# Patient Record
Sex: Female | Born: 1935 | Hispanic: Refuse to answer | Marital: Married | Smoking: Never smoker
Health system: Southern US, Community
[De-identification: ages and names within clinical notes are randomized; demographics above are authoritative.]

## PROBLEM LIST (undated history)

## (undated) DIAGNOSIS — N189 Chronic kidney disease, unspecified: Secondary | ICD-10-CM

## (undated) DIAGNOSIS — F32A Depression, unspecified: Secondary | ICD-10-CM

## (undated) DIAGNOSIS — E569 Vitamin deficiency, unspecified: Secondary | ICD-10-CM

## (undated) DIAGNOSIS — E785 Hyperlipidemia, unspecified: Secondary | ICD-10-CM

## (undated) DIAGNOSIS — F329 Major depressive disorder, single episode, unspecified: Secondary | ICD-10-CM

## (undated) DIAGNOSIS — R413 Other amnesia: Secondary | ICD-10-CM

## (undated) DIAGNOSIS — I6529 Occlusion and stenosis of unspecified carotid artery: Secondary | ICD-10-CM

## (undated) DIAGNOSIS — I10 Essential (primary) hypertension: Secondary | ICD-10-CM

## (undated) HISTORY — DX: Hyperlipidemia, unspecified: E78.5

## (undated) HISTORY — DX: Other amnesia: R41.3

## (undated) HISTORY — DX: Occlusion and stenosis of unspecified carotid artery: I65.29

## (undated) HISTORY — DX: Vitamin deficiency, unspecified: E56.9

## (undated) HISTORY — DX: Chronic kidney disease, unspecified: N18.9

## (undated) HISTORY — DX: Essential (primary) hypertension: I10

## (undated) HISTORY — DX: Depression, unspecified: F32.A

## (undated) HISTORY — PX: ABDOMINAL HYSTERECTOMY: SHX81

## (undated) HISTORY — DX: Major depressive disorder, single episode, unspecified: F32.9

---

## 2010-07-17 ENCOUNTER — Telehealth (INDEPENDENT_AMBULATORY_CARE_PROVIDER_SITE_OTHER): Payer: Self-pay | Admitting: *Deleted

## 2010-07-24 NOTE — Progress Notes (Signed)
Summary: old cxr- new consult  Phone Note Call from Patient Call back at Home Phone    Caller: Patient Call For: clance Summary of Call: pt is a new consult scheduled for 07/31/10 w/ dr Levester Fresh- referring dr is dr. Charlesetta Shanks. pt called to say that she has tried to get records re: old cxr from [facility]. they state they are unable to locate- pt. isn't sure when she had this done. however, notes from refer dr. are being faxed. if anything further is needed- call pt at [phone] Initial call taken by: Tivis Ringer, CNA,  July 17, 2010 2:19 PM  Follow-up for Phone Call        noted. Arman Filter LPN  July 17, 2010 3:53 PM

## 2010-07-31 ENCOUNTER — Encounter: Payer: Self-pay | Admitting: Pulmonary Disease

## 2010-07-31 ENCOUNTER — Ambulatory Visit (INDEPENDENT_AMBULATORY_CARE_PROVIDER_SITE_OTHER): Payer: Medicare Other | Admitting: Pulmonary Disease

## 2010-07-31 ENCOUNTER — Ambulatory Visit (INDEPENDENT_AMBULATORY_CARE_PROVIDER_SITE_OTHER)
Admission: RE | Admit: 2010-07-31 | Discharge: 2010-07-31 | Disposition: A | Discharge status: Home / Self Care | Payer: Medicare Other | Source: Ambulatory Visit | Attending: Pulmonary Disease | Admitting: Pulmonary Disease

## 2010-07-31 VITALS — BP 142/74 | HR 76 | Temp 97.9°F | Ht 64.0 in | Wt 188.2 lb

## 2010-07-31 DIAGNOSIS — R053 Chronic cough: Secondary | ICD-10-CM | POA: Insufficient documentation

## 2010-07-31 DIAGNOSIS — R05 Cough: Secondary | ICD-10-CM

## 2010-07-31 DIAGNOSIS — R059 Cough, unspecified: Secondary | ICD-10-CM

## 2010-07-31 MED ORDER — OMEPRAZOLE 40 MG PO CPDR: 40.0000 mg | DELAYED_RELEASE_CAPSULE | Freq: Two times a day (BID) | ORAL | Status: DC

## 2010-07-31 MED ORDER — BENZONATATE 100 MG PO CAPS: 200.0000 mg | ORAL_CAPSULE | Freq: Four times a day (QID) | ORAL | Status: AC | PRN

## 2010-07-31 NOTE — Assessment & Plan Note (Signed)
The pt has a chronic cough that is most likely upper rather than lower airway in origin.  She had significant improvement with starting PPI, but then returned.  Most likely etiologies for this include PND, LPR, and cyclical cough.  Will check a cxr for completeness, but will intensify treatment for reflux and work on behavioral therapies that will help a cyclical cough.  Will also treat for postnasal drip emperically.

## 2010-07-31 NOTE — Progress Notes (Signed)
  Subjective:    Patient ID: Amanda Owen, female    DOB: 1922-06-04, 77 y.o.   MRN: [id number]  HPI The pt is an 77y/o female who I have been asked to see for chronic cough.  Started about 2 mos ago after a URI, and has continued.  It improved very little with d/c ace, but did greatly improve after starting once a day PPI.  Is dry in nature, and associated with a tickle in throat.  Denies globus sensation, but does have throat clearing.  She denies any chest congestion or worsening sob.  She denies PND, but has dysphonia at times during our visit which clears with throat clearing.  She has never smoked.  Her last cxr was about a year ago, and was unremarkable.     Review of Systems  Constitutional: Negative for fever, appetite change and unexpected weight change.  HENT: Positive for rhinorrhea and sneezing. Negative for ear pain, congestion and postnasal drip.   Eyes: Negative for redness.  Respiratory: Positive for cough and shortness of breath. Negative for chest tightness and wheezing.   Cardiovascular: Negative for chest pain, palpitations and leg swelling.  Gastrointestinal: Negative for nausea, abdominal pain and diarrhea.  Genitourinary: Negative for dysuria.  Musculoskeletal: Negative for joint swelling.  Skin: Negative for rash.  Neurological: Negative for syncope and headaches.  Hematological: Does not bruise/bleed easily.  Psychiatric/Behavioral: Positive for dysphoric mood. The patient is not nervous/anxious.        Objective:   Physical Exam Constitutional:  Well developed, no acute distress  HENT:  Nares patent without discharge  Oropharynx without exudate, palate and uvula are normal  There does appear to be PND present  Eyes:  Perrla, eomi, no scleral icterus  Neck:  No JVD, no TMG  Cardiovascular:  Normal rate, regular rhythm, no rubs or gallops.  No murmurs        Intact distal pulses  Pulmonary :  Normal breath sounds, no stridor or respiratory distress   No  rales, rhonchi, or wheezing  Abdominal:  Soft, nondistended, bowel sounds present.  No tenderness noted.   Musculoskeletal:  +1lower extremity edema noted.  Lymph Nodes:  No cervical lymphadenopathy noted  Skin:  No cyanosis noted  Neurologic:  Alert, appropriate, moves all 4 extremities without obvious deficit.         Assessment & Plan:

## 2010-07-31 NOTE — Patient Instructions (Signed)
Increase omeprazole for reflux to one in am AND pm until next visit Chlorpheniramine over the counter 8mg  at bedtime for postnasal drip No yelling, singing, or overusing your voice No throat clearing.  Just swallow or drink something. Can take tessalon pearls for cough as directed. Hard candy (no peppermint or menthol) all during the day to bathe the back of your throat and prevent tickle followup with me in 3 weeks.

## 2010-08-01 ENCOUNTER — Telehealth: Payer: Self-pay | Admitting: *Deleted

## 2010-08-01 ENCOUNTER — Encounter: Payer: Self-pay | Admitting: Pulmonary Disease

## 2010-08-01 NOTE — Telephone Encounter (Signed)
-----   Message from Barbaraann Share, MD sent at 08/01/2010 1:53 PM -----  Please let pt know that her cxr shows possible pna, but it may just be scarring Call in levquin 750mg  one each day for 5 days, but have her continue with the other things we discussed.     [id number]

## 2010-08-01 NOTE — Progress Notes (Signed)
  Subjective:    Patient ID: Amanda Owen, female    DOB: [birth year], 77 y.o.   MRN: [id number]  HPI    Review of Systems     Objective:   Physical Exam        Assessment & Plan:

## 2010-08-01 NOTE — Progress Notes (Signed)
  Subjective:    Patient ID: Amanda Owen, female    DOB: 07/03/1922, 77 y.o.   MRN: [id number]  HPI    Review of Systems     Objective:   Physical Exam        Assessment & Plan:   

## 2010-08-01 NOTE — Progress Notes (Signed)
The pt's cxr may indicate right lung pna.  Will treat with abx to see if cough improves.

## 2010-08-02 NOTE — Telephone Encounter (Signed)
[id number] 

## 2010-08-02 NOTE — Telephone Encounter (Signed)
LMOM at home # and cell #.

## 2010-08-06 MED ORDER — LEVOFLOXACIN 750 MG PO TABS: ORAL_TABLET | ORAL | Status: DC

## 2010-08-06 NOTE — Telephone Encounter (Signed)
Pt's daughter, Ardeth Sportsman, returned call to [place].  She was informed of pt's CXR results and recs per [facility].  She verbalized understanding of this and aware abx sent to [facility] in [place].

## 2010-08-27 ENCOUNTER — Ambulatory Visit: Payer: Medicare Other | Admitting: Pulmonary Disease

## 2010-08-29 ENCOUNTER — Ambulatory Visit (INDEPENDENT_AMBULATORY_CARE_PROVIDER_SITE_OTHER): Payer: Medicare Other | Admitting: Pulmonary Disease

## 2010-08-29 ENCOUNTER — Ambulatory Visit (INDEPENDENT_AMBULATORY_CARE_PROVIDER_SITE_OTHER)
Admission: RE | Admit: 2010-08-29 | Discharge: 2010-08-29 | Disposition: A | Discharge status: Home / Self Care | Payer: Medicare Other | Source: Ambulatory Visit | Attending: Pulmonary Disease | Admitting: Pulmonary Disease

## 2010-08-29 ENCOUNTER — Encounter: Payer: Self-pay | Admitting: Pulmonary Disease

## 2010-08-29 DIAGNOSIS — J189 Pneumonia, unspecified organism: Secondary | ICD-10-CM

## 2010-08-29 DIAGNOSIS — R05 Cough: Secondary | ICD-10-CM

## 2010-08-29 MED ORDER — PREDNISONE 10 MG PO TABS: ORAL_TABLET | ORAL | Status: DC

## 2010-08-29 MED ORDER — BENZONATATE 100 MG PO CAPS: 200.0000 mg | ORAL_CAPSULE | Freq: Four times a day (QID) | ORAL | Status: AC | PRN

## 2010-08-29 NOTE — Progress Notes (Signed)
  Subjective:    Patient ID: Amanda Owen, female    DOB: 1922-10-01, 77 y.o.   MRN: [id number]  HPI The pt comes in today for f/u of her chronic cough.  She was felt to have an upper airway issue related to postnasal drip, cyclical cough mechanism, and possible reflux.  She was treated for common upper airway causes of chronic cough, but cxr that day with early infiltrate? Vs scar or atx?  She was treated with a course of abx in addition to the above measures.  She comes in today where she feels her cough is 50% better at least.  Unfortunately, she quit the behavioral therapies we discussed last visit, and now is having gagging/constant throat clearing during our visit.  The daughter is concerned because she is still coughing up small quantity of discolored mucus at times.  The pt denies sob or chest congestion.  She is having postnasal drip   Review of Systems  Constitutional: Negative for fever and unexpected weight change.  HENT: Positive for rhinorrhea and postnasal drip. Negative for ear pain, nosebleeds, congestion, sore throat, sneezing, trouble swallowing, dental problem and sinus pressure.   Eyes: Negative for redness and itching.  Respiratory: Positive for cough. Negative for chest tightness, shortness of breath and wheezing.   Cardiovascular: Negative for palpitations and leg swelling.  Gastrointestinal: Negative for nausea and vomiting.  Genitourinary: Negative for dysuria.  Musculoskeletal: Negative for joint swelling.  Skin: Negative for rash.  Neurological: Negative for headaches.  Hematological: Does not bruise/bleed easily.  Psychiatric/Behavioral: Negative for dysphoric mood. The patient is not nervous/anxious.        Objective:   Physical Exam Obese female in nad Nares with inflammatory changes, mild purulence seen in right nare OP clear, no exudates Chest totally clear to auscultation.  No wheezing or rhonchi Cor with rrr LE with 1+ edema, no cyanosis Alert and  oriented, moves all 4        Assessment & Plan:

## 2010-08-29 NOTE — Assessment & Plan Note (Signed)
The pt's states her cough is 50% better with treatment for upper airway causes of cough.  However, she quit doing the various behavioral therapies we discussed, and today in the office she is doing a lot of gagging and throat clearing.  I have told her the cough will never resolve until this is "cooled off".  She needs to get back on her treatments from last visit.  Will also treat her with a course of prednisone to help with upper airway inflammation.  Her cxr showed ?pna, and she was treated with a course of abx.  I suspect she did not have pna, but will check f/u cxr for completeness.  I am concerned about the possibility of sinusitis, with her continuing to have postnasal drip and having some purulent mucus with cough.  Will check a ct sinuses to evaluate for this.

## 2010-08-29 NOTE — Patient Instructions (Addendum)
You need to continue with the behavioral therapies we discussed:  No throat clearing          Hard candy in mouth all day to avoid cough and throat clearing          Limit voice use as much as possible     Will check followup cxr today to see if abnormality still present Will check xray of sinuses, and call you with results Can use tessalon pearls 100mg  2 every 6hrs if needed for cough Stay on medication for acid reflux at twice a day Will put on short course of prednisone to help with upper airway inflammation as well.

## 2010-09-03 ENCOUNTER — Telehealth: Payer: Self-pay | Admitting: Pulmonary Disease

## 2010-09-03 ENCOUNTER — Ambulatory Visit (INDEPENDENT_AMBULATORY_CARE_PROVIDER_SITE_OTHER)
Admission: RE | Admit: 2010-09-03 | Discharge: 2010-09-03 | Disposition: A | Discharge status: Home / Self Care | Payer: Medicare Other | Source: Ambulatory Visit | Attending: Pulmonary Disease | Admitting: Pulmonary Disease

## 2010-09-03 DIAGNOSIS — J189 Pneumonia, unspecified organism: Secondary | ICD-10-CM

## 2010-09-03 NOTE — Telephone Encounter (Signed)
Called and spoke with pt's daughter.  Daughter aware of pt's cxr results and stated she will relay message to pt.

## 2010-09-19 ENCOUNTER — Emergency Department (HOSPITAL_COMMUNITY)
Admission: EM | Admit: 2010-09-19 | Discharge: 2010-09-19 | Disposition: A | Discharge status: Home / Self Care | Payer: Medicare Other | Attending: Emergency Medicine | Admitting: Emergency Medicine

## 2010-09-19 DIAGNOSIS — F068 Other specified mental disorders due to known physiological condition: Secondary | ICD-10-CM | POA: Insufficient documentation

## 2010-09-19 DIAGNOSIS — G309 Alzheimer's disease, unspecified: Secondary | ICD-10-CM | POA: Insufficient documentation

## 2010-09-19 DIAGNOSIS — I10 Essential (primary) hypertension: Secondary | ICD-10-CM | POA: Insufficient documentation

## 2010-09-19 DIAGNOSIS — Z79899 Other long term (current) drug therapy: Secondary | ICD-10-CM | POA: Insufficient documentation

## 2010-09-19 DIAGNOSIS — F0280 Dementia in other diseases classified elsewhere without behavioral disturbance: Secondary | ICD-10-CM | POA: Insufficient documentation

## 2010-09-19 DIAGNOSIS — R55 Syncope and collapse: Secondary | ICD-10-CM | POA: Insufficient documentation

## 2010-09-19 DIAGNOSIS — N289 Disorder of kidney and ureter, unspecified: Secondary | ICD-10-CM | POA: Insufficient documentation

## 2010-09-19 LAB — URINALYSIS, ROUTINE W REFLEX MICROSCOPIC
Bilirubin Urine: NEGATIVE
Glucose, UA: NEGATIVE mg/dL
Hgb urine dipstick: NEGATIVE
Ketones, ur: 15 mg/dL — AB
Protein, ur: NEGATIVE mg/dL
pH: 5 (ref 5.0–8.0)

## 2010-09-19 LAB — BASIC METABOLIC PANEL
BUN: 18 mg/dL (ref 6–23)
CO2: 28 mEq/L (ref 19–32)
Calcium: 10.1 mg/dL (ref 8.4–10.5)
Chloride: 104 mEq/L (ref 96–112)
Creatinine, Ser: 1.65 mg/dL — ABNORMAL HIGH (ref 0.4–1.2)
GFR calc Af Amer: 36 mL/min — ABNORMAL LOW (ref >60)
Glucose, Bld: 93 mg/dL (ref 70–99)

## 2010-09-19 LAB — DIFFERENTIAL
Basophils Relative: 0 % (ref 0–1)
Lymphs Abs: 1.6 10*3/uL (ref 0.7–4.0)
Monocytes Absolute: 1 10*3/uL (ref 0.1–1.0)
Monocytes Relative: 11 % (ref 3–12)
Neutro Abs: 6.3 10*3/uL (ref 1.7–7.7)
Neutrophils Relative %: 69 % (ref 43–77)

## 2010-09-19 LAB — CBC
Hemoglobin: 12.9 g/dL (ref 12.0–15.0)
MCH: 29.8 pg (ref 26.0–34.0)
MCHC: 32.8 g/dL (ref 30.0–36.0)
MCV: 90.8 fL (ref 78.0–100.0)
RBC: 4.33 MIL/uL (ref 3.87–5.11)

## 2013-03-04 ENCOUNTER — Ambulatory Visit: Payer: Self-pay | Admitting: Internal Medicine

## 2014-02-26 ENCOUNTER — Emergency Department (HOSPITAL_BASED_OUTPATIENT_CLINIC_OR_DEPARTMENT_OTHER)
Admission: EM | Admit: 2014-02-26 | Discharge: 2014-02-26 | Disposition: A | Discharge status: Home / Self Care | Payer: Medicare Other | Attending: Emergency Medicine | Admitting: Emergency Medicine

## 2014-02-26 ENCOUNTER — Emergency Department (HOSPITAL_BASED_OUTPATIENT_CLINIC_OR_DEPARTMENT_OTHER): Payer: Medicare Other

## 2014-02-26 ENCOUNTER — Encounter (HOSPITAL_BASED_OUTPATIENT_CLINIC_OR_DEPARTMENT_OTHER): Payer: Self-pay | Admitting: Emergency Medicine

## 2014-02-26 DIAGNOSIS — I129 Hypertensive chronic kidney disease with stage 1 through stage 4 chronic kidney disease, or unspecified chronic kidney disease: Secondary | ICD-10-CM | POA: Insufficient documentation

## 2014-02-26 DIAGNOSIS — N189 Chronic kidney disease, unspecified: Secondary | ICD-10-CM | POA: Diagnosis not present

## 2014-02-26 DIAGNOSIS — Z7902 Long term (current) use of antithrombotics/antiplatelets: Secondary | ICD-10-CM | POA: Insufficient documentation

## 2014-02-26 DIAGNOSIS — H269 Unspecified cataract: Secondary | ICD-10-CM | POA: Insufficient documentation

## 2014-02-26 DIAGNOSIS — F329 Major depressive disorder, single episode, unspecified: Secondary | ICD-10-CM | POA: Insufficient documentation

## 2014-02-26 DIAGNOSIS — Z7982 Long term (current) use of aspirin: Secondary | ICD-10-CM | POA: Diagnosis not present

## 2014-02-26 DIAGNOSIS — Z23 Encounter for immunization: Secondary | ICD-10-CM | POA: Diagnosis not present

## 2014-02-26 DIAGNOSIS — S6992XA Unspecified injury of left wrist, hand and finger(s), initial encounter: Secondary | ICD-10-CM | POA: Diagnosis present

## 2014-02-26 DIAGNOSIS — E785 Hyperlipidemia, unspecified: Secondary | ICD-10-CM | POA: Diagnosis not present

## 2014-02-26 DIAGNOSIS — Z79899 Other long term (current) drug therapy: Secondary | ICD-10-CM | POA: Diagnosis not present

## 2014-02-26 DIAGNOSIS — R609 Edema, unspecified: Secondary | ICD-10-CM

## 2014-02-26 DIAGNOSIS — S62325A Displaced fracture of shaft of fourth metacarpal bone, left hand, initial encounter for closed fracture: Secondary | ICD-10-CM | POA: Insufficient documentation

## 2014-02-26 DIAGNOSIS — S0093XA Contusion of unspecified part of head, initial encounter: Secondary | ICD-10-CM | POA: Diagnosis not present

## 2014-02-26 DIAGNOSIS — F0390 Unspecified dementia without behavioral disturbance: Secondary | ICD-10-CM | POA: Diagnosis not present

## 2014-02-26 DIAGNOSIS — Y92008 Other place in unspecified non-institutional (private) residence as the place of occurrence of the external cause: Secondary | ICD-10-CM | POA: Insufficient documentation

## 2014-02-26 DIAGNOSIS — W19XXXA Unspecified fall, initial encounter: Secondary | ICD-10-CM

## 2014-02-26 DIAGNOSIS — W1789XA Other fall from one level to another, initial encounter: Secondary | ICD-10-CM | POA: Diagnosis not present

## 2014-02-26 DIAGNOSIS — S62309A Unspecified fracture of unspecified metacarpal bone, initial encounter for closed fracture: Secondary | ICD-10-CM

## 2014-02-26 DIAGNOSIS — Z9229 Personal history of other drug therapy: Secondary | ICD-10-CM

## 2014-02-26 DIAGNOSIS — S0081XA Abrasion of other part of head, initial encounter: Secondary | ICD-10-CM

## 2014-02-26 MED ORDER — HYDROCODONE-ACETAMINOPHEN 5-325 MG PO TABS: 1.0000 | ORAL_TABLET | Freq: Four times a day (QID) | ORAL | Status: DC | PRN

## 2014-02-26 MED ORDER — TETANUS-DIPHTH-ACELL PERTUSSIS 5-2.5-18.5 LF-MCG/0.5 IM SUSP
0.5000 mL | Freq: Once | INTRAMUSCULAR | Status: AC
  Administered 2014-02-26: 0.5 mL via INTRAMUSCULAR
  Filled 2014-02-26: qty 0.5

## 2014-02-26 NOTE — ED Notes (Signed)
EMT Tresa EndoKelly at bedside to spilt pt's hand

## 2014-02-26 NOTE — ED Notes (Signed)
D/c home with family- rx x 1 for hydrocodone given- tetanus info sheet given- follow up care discussed with pt and family

## 2014-02-26 NOTE — Discharge Instructions (Signed)
Call Dr. Mina MarbleWeingold, or another orthopedic specialist for further evaluation of your fracture. Call for a follow up appointment with a Family or Primary Care Provider.  Return if Symptoms worsen.   Take medication as prescribed.  Keep splint on at all times.  Ice your head and hand 3-4 times a day.  Elevate your hand above your heart to reduce swelling and discomfort.

## 2014-02-26 NOTE — ED Provider Notes (Signed)
Medical screening examination/treatment/procedure(s) were conducted as a shared visit with non-physician practitioner(s) and myself.  I personally evaluated the patient during the encounter.patient is an 80 year old female with history of dementia on Aggrenox. She presents after a fall. She apparently tripped over the rug and struck her forehead on the floor. She denies loss of consciousness but does have significant swelling to her forehead and abrasions to the bridge of her nose. She denies neck pain and also complains of pain in her left hand.  On exam, vitals are stable and the patient is afebrile. Exam of the head reveals a large contusion to the for head and abrasions to the bridge of the nose with no significant deformity. Heart is regular rate and rhythm. Lungs are clear. Neurologically cranial nerves II through XII are grossly intact without focal deficits. She moves all extremities. There is no cervical spine tenderness and no step-off.  Workup reveals negative CT of the head and no evidence for maxillofacial fractures. Hand x-ray does reveal a fourth metacarpal fracture will be treated with an ulnar gutter splint and follow-up with orthopedics.     Geoffery Lyonsouglas Rethel Sebek, MD 02/26/14 2308

## 2014-02-26 NOTE — ED Provider Notes (Signed)
CSN: [id number]     Arrival date & time 02/26/14  1610 History   First MD Initiated Contact with Patient 02/26/14 1647     Chief Complaint  Patient presents with  . Facial Injury     (Consider location/radiation/quality/duration/timing/severity/associated sxs/prior Treatment) HPI Comments: Patient is a 80 year old female with past mental history of dementia, on Aggrenox, presenting to the emergency Department chief complaint of head injury. Patient reports chemical fall, witnessed patient's daughter confirms mechanical fall. Denies LOC, change in vision, numbness tingling to upper and lower extremities. Patient at baseline mental status per daughter. Patient also complains of left hand swelling and pain. Pt is unable to see out of Right eye due to cataract. Unknown last Td. Level V caveat due to history of dementia.  Patient is a 80 y.o. female presenting with facial injury. The history is provided by the patient and a relative. No language interpreter was used.  Facial Injury   Past Medical History  Diagnosis Date  . Hypertension   . Hyperlipidemia   . Memory deficit   . Occlusion of carotid artery   . Vitamin deficiency   . Chronic kidney failure   . Depression    Past Surgical History  Procedure Laterality Date  . Abdominal hysterectomy     Family History  Problem Relation Age of Onset  . Heart disease Mother   . Heart disease Sister   . Heart disease Brother    History  Substance Use Topics  . Smoking status: Never Smoker   . Smokeless tobacco: Not on file  . Alcohol Use: No   OB History   Grav Para Term Preterm Abortions TAB SAB Ect Mult Living                 Review of Systems  Unable to perform ROS: Dementia      Allergies  Review of patient's allergies indicates no known allergies.  Home Medications   Prior to Admission medications   Medication Sig Start Date End Date Taking? Authorizing Provider  dipyridamole-aspirin (AGGRENOX) 200-25 MG per 12  hr capsule Take 1 capsule by mouth 2 (two) times daily.   Yes Historical Provider, MD  amLODipine (NORVASC) 5 MG tablet Take 5 mg by mouth daily.      Historical Provider, MD  aspirin 81 MG tablet Take 81 mg by mouth daily.      Historical Provider, MD  citalopram (CELEXA) 20 MG tablet Take 20 mg by mouth daily.      Historical Provider, MD  clopidogrel (PLAVIX) 75 MG tablet Take 75 mg by mouth daily.      Historical Provider, MD  donepezil (ARICEPT) 10 MG tablet Take 10 mg by mouth at bedtime as needed.      Historical Provider, MD  memantine (NAMENDA) 10 MG tablet Take 10 mg by mouth 2 (two) times daily.      Historical Provider, MD  omeprazole (PRILOSEC) 40 MG capsule Take 1 capsule (40 mg total) by mouth 2 (two) times daily. 07/31/10   Barbaraann ShareKeith M Clance, MD  predniSONE (DELTASONE) 10 MG tablet Take 4 tabs by mouth x 2 days, 2 tabs by mouth for 2 days, 1 tab by mouth x 2 days then stop 08/29/10   Barbaraann ShareKeith M Clance, MD  simvastatin (ZOCOR) 80 MG tablet Take 80 mg by mouth at bedtime.      Historical Provider, MD   BP 149/69  Pulse 80  Temp(Src) 97.8 F (36.6 C) (Oral)  Resp 80  Ht 5\' 4"  (1.626 m)  Wt 196 lb (88.905 kg)  BMI 33.63 kg/m2  SpO2 96% Physical Exam  Nursing note and vitals reviewed. Constitutional: She appears well-developed and well-nourished. No distress.  HENT:  Head: Normocephalic. Head is with abrasion and with contusion. Head is without raccoon's eyes and without Battle's sign.    Right Ear: No hemotympanum.  Left Ear: No hemotympanum.  Nose: No nasal deformity or septal deviation. No epistaxis.  Large hematoma to left-sided forehead. Abrasion to nasal bridge and right sided upper lip with minimal oozing blood. Upper lip abrasion, upper and lower dentures in place, no fracture. No malocclusion.  Neck: Neck supple.  Cardiovascular: Normal rate and regular rhythm.   Pulmonary/Chest: Effort normal. No respiratory distress. She has no wheezes. She has no rales.  Abdominal:  Soft. There is no tenderness. There is no rebound.  Musculoskeletal:       Left hand: She exhibits decreased range of motion, tenderness, bony tenderness and swelling.       Hands: Left upper extremity: Decrease 4th knuckle contour.  Moderate swelling to dorsum of hand.  Neurological: She is alert. She is disoriented. No cranial nerve deficit or sensory deficit. She exhibits normal muscle tone. Coordination normal. GCS eye subscore is 4. GCS verbal subscore is 5. GCS motor subscore is 6.  Oriented to person and place, daughter, disoriented to time. Speech is clear and goal oriented, follows commands II-Visual fields were normal.   III/IV/VI-Pupils were equal and reacted. Extraocular movements were full and conjugate.  V/VII-no facial droop.   VIII-normal.   Motor: Strength 5/5 to upper and lower extremities bilaterally. Moves all 4 extremities equally. Sensory: normal sensation to upper and lower extremities.  Cerebellar: Normal finger to nose bilaterally  Skin: Skin is warm and dry. She is not diaphoretic.  Psychiatric: Her behavior is normal.    ED Course  Procedures (including critical care time) Labs Review Labs Reviewed - No data to display  Imaging Review Dg Wrist Complete Left  02/26/2014   CLINICAL DATA:  Fall, left hand swelling. Left hand and wrist pain. Initial encounter.  EXAM: LEFT WRIST - COMPLETE 3+ VIEW  COMPARISON:  None.  FINDINGS: There is an oblique fracture through the left fourth metacarpal. Mild displacement. No additional acute bony abnormality.  IMPRESSION: Oblique displaced fracture through the left fourth metacarpal.   Electronically Signed   By: Charlett NoseKevin  Dover M.D.   On: 02/26/2014 18:20   Ct Head Wo Contrast  02/26/2014   CLINICAL DATA:  80 year old female with history of trauma after falling face forward out of a truck on to the ground. Hematoma on the forehead and laceration across the bridge of the nose, with abrasion on the cheeks.  EXAM: CT HEAD WITHOUT  CONTRAST  CT MAXILLOFACIAL WITHOUT CONTRAST  TECHNIQUE: Multidetector CT imaging of the head and maxillofacial structures were performed using the standard protocol without intravenous contrast. Multiplanar CT image reconstructions of the maxillofacial structures were also generated.  COMPARISON:  Head CT 06/16/2012.  FINDINGS: CT HEAD FINDINGS  Large amount of high attenuation soft tissue swelling in the frontal regions (right greater than left) extending into the region of the bridge of the nose, compatible with the reported hematoma/laceration. No acute displaced skull fractures are identified. Physiologic calcifications are noted in the basal ganglia bilaterally. Mild cerebral atrophy. Patchy and confluent areas of decreased attenuation are noted throughout the deep and periventricular white matter of the cerebral hemispheres bilaterally, compatible with chronic microvascular ischemic disease. No  acute intracranial abnormality. Specifically, no evidence of acute post-traumatic intracranial hemorrhage, no definite regions of acute/subacute cerebral ischemia, no focal mass, mass effect, hydrocephalus or abnormal intra or extra-axial fluid collections. The visualized paranasal sinuses and mastoids are well pneumatized.  CT MAXILLOFACIAL FINDINGS  As mentioned above, there is a large amount of high attenuation soft tissue swelling in the frontal scalp bilaterally (right greater than left) extending to the bridge of the nose, compatible with the reported hematoma/laceration. No underlying displaced facial bone fractures are noted. Pterygoid plates are intact. Mandibular condyles are located bilaterally, and the mandibles intact. Paranasal sinuses and mastoids are well pneumatized.  IMPRESSION: 1. Large frontal scalp hematoma/laceration with soft tissue swelling extending down to the bridge of the nose. No underlying displaced skull fracture or facial bone fractures. 2. No signs of significant acute traumatic injury  to the brain. 3. Mild cerebral atrophy with chronic microvascular ischemic changes in the cerebral white matter, similar to the prior study.   Electronically Signed   By: Trudie Reed M.D.   On: 02/26/2014 18:18   Dg Hand Complete Left  02/26/2014   CLINICAL DATA:  LEFT hand and wrist pain post fall, LEFT hand swelling  EXAM: LEFT HAND - COMPLETE 3+ VIEW  COMPARISON:  None  FINDINGS: Diffuse osseous demineralization.  Mild scattered narrowing of interphalangeal joints.  Displaced oblique fracture at diaphysis of fourth metacarpal.  No articular extension.  No additional fracture, dislocation, or bone destruction.  IMPRESSION: Displaced oblique fourth metacarpal diaphyseal fracture.  Osseous demineralization.   Electronically Signed   By: Ulyses Southward M.D.   On: 02/26/2014 18:21   Ct Maxillofacial Wo Cm  02/26/2014   CLINICAL DATA:  80 year old female with history of trauma after falling face forward out of a truck on to the ground. Hematoma on the forehead and laceration across the bridge of the nose, with abrasion on the cheeks.  EXAM: CT HEAD WITHOUT CONTRAST  CT MAXILLOFACIAL WITHOUT CONTRAST  TECHNIQUE: Multidetector CT imaging of the head and maxillofacial structures were performed using the standard protocol without intravenous contrast. Multiplanar CT image reconstructions of the maxillofacial structures were also generated.  COMPARISON:  Head CT 06/16/2012.  FINDINGS: CT HEAD FINDINGS  Large amount of high attenuation soft tissue swelling in the frontal regions (right greater than left) extending into the region of the bridge of the nose, compatible with the reported hematoma/laceration. No acute displaced skull fractures are identified. Physiologic calcifications are noted in the basal ganglia bilaterally. Mild cerebral atrophy. Patchy and confluent areas of decreased attenuation are noted throughout the deep and periventricular white matter of the cerebral hemispheres bilaterally, compatible  with chronic microvascular ischemic disease. No acute intracranial abnormality. Specifically, no evidence of acute post-traumatic intracranial hemorrhage, no definite regions of acute/subacute cerebral ischemia, no focal mass, mass effect, hydrocephalus or abnormal intra or extra-axial fluid collections. The visualized paranasal sinuses and mastoids are well pneumatized.  CT MAXILLOFACIAL FINDINGS  As mentioned above, there is a large amount of high attenuation soft tissue swelling in the frontal scalp bilaterally (right greater than left) extending to the bridge of the nose, compatible with the reported hematoma/laceration. No underlying displaced facial bone fractures are noted. Pterygoid plates are intact. Mandibular condyles are located bilaterally, and the mandibles intact. Paranasal sinuses and mastoids are well pneumatized.  IMPRESSION: 1. Large frontal scalp hematoma/laceration with soft tissue swelling extending down to the bridge of the nose. No underlying displaced skull fracture or facial bone fractures. 2. No signs of significant  acute traumatic injury to the brain. 3. Mild cerebral atrophy with chronic microvascular ischemic changes in the cerebral white matter, similar to the prior study.   Electronically Signed   By: Trudie Reed M.D.   On: 02/26/2014 18:18     EKG Interpretation None      MDM   Final diagnoses:  Fall  Swelling  Head contusion, initial encounter  Metacarpal bone fracture, closed, initial encounter  Facial abrasion, initial encounter   Patient presents after witnessed mechanical fall, no loss consciousness returned to baseline. Patient has large hematoma to forehead and history of aggrenox. No neurologic deficits on exam. Left dorsum of hand concerning for metacarpal fracture.  CT head and face without intercranial process, shows large hematoma and no facial fractures.  X-ray shows fourth metacarpal fracture, plan to treat with splint follow-up with a hand  specialist. Plan to update tetanus, wound care.  Discussed imaging results, and treatment plan with the patient and patient's family. Return precautions given. Reports understanding and no other concerns at this time.  Patient is stable for discharge at this time. Dr. Berniece Andreas also evaluated the patient during this encounter.  Meds given in ED:  Medications  Tdap (BOOSTRIX) injection 0.5 mL (0.5 mLs Intramuscular Given 02/26/14 1904)    New Prescriptions   HYDROCODONE-ACETAMINOPHEN (NORCO/VICODIN) 5-325 MG PER TABLET    Take 1 tablet by mouth every 6 (six) hours as needed for moderate pain or severe pain.        Mellody Drown, PA-C 02/26/14 2130

## 2014-02-26 NOTE — ED Notes (Addendum)
Patient fell at home getting out of a truck and injured her face. Abrasion to her nose, large hematoma to her forehead, and her left hand is swelling. Mild confusion is patients baseline.

## 2019-10-08 ENCOUNTER — Inpatient Hospital Stay (HOSPITAL_COMMUNITY)
Admission: EM | Admit: 2019-10-08 | Discharge: 2019-10-11 | DRG: 683 | Disposition: A | Discharge status: Skilled Nursing Facility | Payer: Medicare Other | Source: Skilled Nursing Facility | Attending: Internal Medicine | Admitting: Internal Medicine

## 2019-10-08 DIAGNOSIS — F419 Anxiety disorder, unspecified: Secondary | ICD-10-CM | POA: Diagnosis present

## 2019-10-08 DIAGNOSIS — Z66 Do not resuscitate: Secondary | ICD-10-CM | POA: Diagnosis present

## 2019-10-08 DIAGNOSIS — Z7982 Long term (current) use of aspirin: Secondary | ICD-10-CM

## 2019-10-08 DIAGNOSIS — Z8249 Family history of ischemic heart disease and other diseases of the circulatory system: Secondary | ICD-10-CM

## 2019-10-08 DIAGNOSIS — N179 Acute kidney failure, unspecified: Secondary | ICD-10-CM | POA: Diagnosis not present

## 2019-10-08 DIAGNOSIS — N3000 Acute cystitis without hematuria: Secondary | ICD-10-CM

## 2019-10-08 DIAGNOSIS — R8271 Bacteriuria: Secondary | ICD-10-CM | POA: Diagnosis present

## 2019-10-08 DIAGNOSIS — F0390 Unspecified dementia without behavioral disturbance: Secondary | ICD-10-CM | POA: Diagnosis present

## 2019-10-08 DIAGNOSIS — N189 Chronic kidney disease, unspecified: Secondary | ICD-10-CM | POA: Diagnosis present

## 2019-10-08 DIAGNOSIS — I129 Hypertensive chronic kidney disease with stage 1 through stage 4 chronic kidney disease, or unspecified chronic kidney disease: Secondary | ICD-10-CM | POA: Diagnosis present

## 2019-10-08 DIAGNOSIS — I951 Orthostatic hypotension: Secondary | ICD-10-CM | POA: Diagnosis present

## 2019-10-08 DIAGNOSIS — I10 Essential (primary) hypertension: Secondary | ICD-10-CM | POA: Diagnosis present

## 2019-10-08 DIAGNOSIS — R778 Other specified abnormalities of plasma proteins: Secondary | ICD-10-CM | POA: Diagnosis present

## 2019-10-08 DIAGNOSIS — Z8673 Personal history of transient ischemic attack (TIA), and cerebral infarction without residual deficits: Secondary | ICD-10-CM

## 2019-10-08 DIAGNOSIS — I4581 Long QT syndrome: Secondary | ICD-10-CM | POA: Diagnosis present

## 2019-10-08 DIAGNOSIS — Z79899 Other long term (current) drug therapy: Secondary | ICD-10-CM

## 2019-10-08 DIAGNOSIS — F0391 Unspecified dementia with behavioral disturbance: Secondary | ICD-10-CM | POA: Diagnosis present

## 2019-10-08 DIAGNOSIS — E876 Hypokalemia: Secondary | ICD-10-CM | POA: Diagnosis present

## 2019-10-08 DIAGNOSIS — F329 Major depressive disorder, single episode, unspecified: Secondary | ICD-10-CM | POA: Diagnosis present

## 2019-10-08 DIAGNOSIS — R7989 Other specified abnormal findings of blood chemistry: Secondary | ICD-10-CM

## 2019-10-08 DIAGNOSIS — R4182 Altered mental status, unspecified: Secondary | ICD-10-CM

## 2019-10-08 DIAGNOSIS — Z7902 Long term (current) use of antithrombotics/antiplatelets: Secondary | ICD-10-CM

## 2019-10-08 DIAGNOSIS — Z90710 Acquired absence of both cervix and uterus: Secondary | ICD-10-CM

## 2019-10-08 DIAGNOSIS — R55 Syncope and collapse: Secondary | ICD-10-CM

## 2019-10-08 DIAGNOSIS — E785 Hyperlipidemia, unspecified: Secondary | ICD-10-CM | POA: Diagnosis present

## 2019-10-08 DIAGNOSIS — E861 Hypovolemia: Secondary | ICD-10-CM | POA: Diagnosis present

## 2019-10-08 DIAGNOSIS — Z20822 Contact with and (suspected) exposure to covid-19: Secondary | ICD-10-CM | POA: Diagnosis present

## 2019-10-08 NOTE — ED Triage Notes (Signed)
Pt presents to ED from [facility]. Per facility faculty wheeling pt back to room in wheelchair. Pt had "syncopal episode" and was assisted to floor from chair. Pt GCS: 9. EMS unaware if this is baseline. EMS VS-  131/50 HR - 51

## 2019-10-09 ENCOUNTER — Emergency Department (HOSPITAL_COMMUNITY): Payer: Medicare Other

## 2019-10-09 ENCOUNTER — Observation Stay (HOSPITAL_COMMUNITY): Payer: Medicare Other

## 2019-10-09 ENCOUNTER — Encounter (HOSPITAL_COMMUNITY): Payer: Self-pay | Admitting: Family Medicine

## 2019-10-09 ENCOUNTER — Other Ambulatory Visit: Payer: Self-pay

## 2019-10-09 DIAGNOSIS — R55 Syncope and collapse: Secondary | ICD-10-CM | POA: Diagnosis present

## 2019-10-09 DIAGNOSIS — G9341 Metabolic encephalopathy: Secondary | ICD-10-CM

## 2019-10-09 DIAGNOSIS — R8271 Bacteriuria: Secondary | ICD-10-CM | POA: Diagnosis present

## 2019-10-09 DIAGNOSIS — N3000 Acute cystitis without hematuria: Secondary | ICD-10-CM | POA: Insufficient documentation

## 2019-10-09 DIAGNOSIS — F0390 Unspecified dementia without behavioral disturbance: Secondary | ICD-10-CM | POA: Diagnosis present

## 2019-10-09 DIAGNOSIS — N179 Acute kidney failure, unspecified: Secondary | ICD-10-CM | POA: Diagnosis not present

## 2019-10-09 DIAGNOSIS — F0391 Unspecified dementia with behavioral disturbance: Secondary | ICD-10-CM

## 2019-10-09 DIAGNOSIS — E876 Hypokalemia: Secondary | ICD-10-CM | POA: Diagnosis present

## 2019-10-09 DIAGNOSIS — I10 Essential (primary) hypertension: Secondary | ICD-10-CM

## 2019-10-09 DIAGNOSIS — R778 Other specified abnormalities of plasma proteins: Secondary | ICD-10-CM

## 2019-10-09 DIAGNOSIS — Z8673 Personal history of transient ischemic attack (TIA), and cerebral infarction without residual deficits: Secondary | ICD-10-CM

## 2019-10-09 LAB — GLUCOSE, CAPILLARY
Glucose-Capillary: 69 mg/dL — ABNORMAL LOW (ref 70–99)
Glucose-Capillary: 117 mg/dL — ABNORMAL HIGH (ref 70–99)
Glucose-Capillary: 101 mg/dL — ABNORMAL HIGH (ref 70–99)
Glucose-Capillary: 79 mg/dL (ref 70–99)

## 2019-10-09 LAB — CBC WITH DIFFERENTIAL/PLATELET
Abs Immature Granulocytes: 0.05 10*3/uL (ref 0.00–0.07)
Basophils Absolute: 0 10*3/uL (ref 0.0–0.1)
Basophils Relative: 0 %
Eosinophils Absolute: 0 10*3/uL (ref 0.0–0.5)
Eosinophils Relative: 1 %
HCT: 42.9 % (ref 36.0–46.0)
Hemoglobin: 13.3 g/dL (ref 12.0–15.0)
Immature Granulocytes: 1 %
Lymphocytes Relative: 19 %
Lymphs Abs: 1.6 10*3/uL (ref 0.7–4.0)
MCH: 29.8 pg (ref 26.0–34.0)
MCHC: 31 g/dL (ref 30.0–36.0)
MCV: 96 fL (ref 80.0–100.0)
Monocytes Absolute: 0.6 10*3/uL (ref 0.1–1.0)
Monocytes Relative: 8 %
Neutro Abs: 5.9 10*3/uL (ref 1.7–7.7)
Neutrophils Relative %: 71 %
Platelets: 244 10*3/uL (ref 150–400)
RBC: 4.47 MIL/uL (ref 3.87–5.11)
RDW: 16.2 % — ABNORMAL HIGH (ref 11.5–15.5)
WBC: 8.3 10*3/uL (ref 4.0–10.5)
nRBC: 0 % (ref 0.0–0.2)

## 2019-10-09 LAB — TROPONIN I (HIGH SENSITIVITY)
Troponin I (High Sensitivity): 24 ng/L — ABNORMAL HIGH (ref <18)
Troponin I (High Sensitivity): 24 ng/L — ABNORMAL HIGH

## 2019-10-09 LAB — URINALYSIS, ROUTINE W REFLEX MICROSCOPIC
Bilirubin Urine: NEGATIVE
Glucose, UA: NEGATIVE mg/dL
Ketones, ur: 5 mg/dL — AB
Nitrite: NEGATIVE
Protein, ur: 100 mg/dL — AB
Specific Gravity, Urine: 1.019 (ref 1.005–1.030)
pH: 5 (ref 5.0–8.0)

## 2019-10-09 LAB — COMPREHENSIVE METABOLIC PANEL
ALT: 16 U/L (ref 0–44)
AST: 23 U/L (ref 15–41)
Albumin: 2.9 g/dL — ABNORMAL LOW (ref 3.5–5.0)
Alkaline Phosphatase: 81 U/L (ref 38–126)
Anion gap: 11 (ref 5–15)
BUN: 26 mg/dL — ABNORMAL HIGH (ref 8–23)
CO2: 23 mmol/L (ref 22–32)
Calcium: 9 mg/dL (ref 8.9–10.3)
Chloride: 104 mmol/L (ref 98–111)
Creatinine, Ser: 1.41 mg/dL — ABNORMAL HIGH (ref 0.44–1.00)
GFR calc Af Amer: 37 mL/min — ABNORMAL LOW (ref >60)
GFR calc non Af Amer: 32 mL/min — ABNORMAL LOW (ref >60)
Glucose, Bld: 98 mg/dL (ref 70–99)
Potassium: 3.4 mmol/L — ABNORMAL LOW (ref 3.5–5.1)
Sodium: 138 mmol/L (ref 135–145)
Total Bilirubin: 0.6 mg/dL (ref 0.3–1.2)
Total Protein: 6.2 g/dL — ABNORMAL LOW (ref 6.5–8.1)

## 2019-10-09 LAB — TSH: TSH: 1.758 u[IU]/mL (ref 0.350–4.500)

## 2019-10-09 LAB — URINALYSIS, MICROSCOPIC (REFLEX): WBC, UA: >50 WBC/hpf (ref 0–5)

## 2019-10-09 LAB — SARS CORONAVIRUS 2 BY RT PCR (HOSPITAL ORDER, PERFORMED IN ~~LOC~~ HOSPITAL LAB): SARS Coronavirus 2: NEGATIVE

## 2019-10-09 LAB — VITAMIN B12: Vitamin B-12: 426 pg/mL (ref 180–914)

## 2019-10-09 LAB — SODIUM, URINE, RANDOM: Sodium, Ur: 61 mmol/L

## 2019-10-09 LAB — CREATININE, URINE, RANDOM: Creatinine, Urine: 244.89 mg/dL

## 2019-10-09 MED ORDER — TRAZODONE HCL 50 MG PO TABS
50.0000 mg | ORAL_TABLET | Freq: Every day | ORAL | Status: DC
  Administered 2019-10-09 – 2019-10-10 (×2): 50 mg via ORAL
  Filled 2019-10-09 (×2): qty 1

## 2019-10-09 MED ORDER — HEPARIN SODIUM (PORCINE) 5000 UNIT/ML IJ SOLN
5000.0000 [IU] | Freq: Three times a day (TID) | INTRAMUSCULAR | Status: DC
  Administered 2019-10-09 – 2019-10-11 (×6): 5000 [IU] via SUBCUTANEOUS
  Filled 2019-10-09 (×6): qty 1

## 2019-10-09 MED ORDER — MAGNESIUM SULFATE IN D5W 1-5 GM/100ML-% IV SOLN
1.0000 g | Freq: Once | INTRAVENOUS | Status: AC
  Administered 2019-10-09: 1 g via INTRAVENOUS
  Filled 2019-10-09: qty 100

## 2019-10-09 MED ORDER — SODIUM CHLORIDE 0.9 % IV SOLN
1.0000 g | Freq: Once | INTRAVENOUS | Status: AC
  Administered 2019-10-09: 1 g via INTRAVENOUS
  Filled 2019-10-09: qty 10

## 2019-10-09 MED ORDER — SENNOSIDES-DOCUSATE SODIUM 8.6-50 MG PO TABS: 1.0000 | ORAL_TABLET | Freq: Every evening | ORAL | Status: DC | PRN

## 2019-10-09 MED ORDER — SODIUM CHLORIDE 0.9% FLUSH
3.0000 mL | Freq: Two times a day (BID) | INTRAVENOUS | Status: DC
  Administered 2019-10-09 – 2019-10-10 (×4): 3 mL via INTRAVENOUS

## 2019-10-09 MED ORDER — MEMANTINE HCL ER 28 MG PO CP24
28.0000 mg | ORAL_CAPSULE | Freq: Every day | ORAL | Status: DC
  Administered 2019-10-09 – 2019-10-10 (×2): 28 mg via ORAL
  Filled 2019-10-09 (×3): qty 1

## 2019-10-09 MED ORDER — ACETAMINOPHEN 650 MG RE SUPP: 650.0000 mg | Freq: Four times a day (QID) | RECTAL | Status: DC | PRN

## 2019-10-09 MED ORDER — ASPIRIN-DIPYRIDAMOLE ER 25-200 MG PO CP12
1.0000 | ORAL_CAPSULE | Freq: Two times a day (BID) | ORAL | Status: DC
  Administered 2019-10-09 – 2019-10-10 (×4): 1 via ORAL
  Filled 2019-10-09 (×8): qty 1

## 2019-10-09 MED ORDER — ACETAMINOPHEN 325 MG PO TABS
650.0000 mg | ORAL_TABLET | Freq: Four times a day (QID) | ORAL | Status: DC | PRN
  Filled 2019-10-09: qty 2

## 2019-10-09 MED ORDER — ALPRAZOLAM 0.25 MG PO TABS: 0.2500 mg | ORAL_TABLET | Freq: Three times a day (TID) | ORAL | Status: DC | PRN

## 2019-10-09 MED ORDER — CITALOPRAM HYDROBROMIDE 20 MG PO TABS
20.0000 mg | ORAL_TABLET | Freq: Every day | ORAL | Status: DC
  Administered 2019-10-09 – 2019-10-10 (×2): 20 mg via ORAL
  Filled 2019-10-09 (×3): qty 1

## 2019-10-09 MED ORDER — POTASSIUM CHLORIDE IN NACL 20-0.9 MEQ/L-% IV SOLN
INTRAVENOUS | Status: AC
  Filled 2019-10-09: qty 1000

## 2019-10-09 MED ORDER — LORAZEPAM 2 MG/ML IJ SOLN: 0.5000 mg | Freq: Once | INTRAMUSCULAR | Status: DC

## 2019-10-09 MED ORDER — METOPROLOL TARTRATE 25 MG PO TABS
25.0000 mg | ORAL_TABLET | Freq: Two times a day (BID) | ORAL | Status: DC
  Administered 2019-10-09 – 2019-10-10 (×4): 25 mg via ORAL
  Filled 2019-10-09 (×5): qty 1

## 2019-10-09 MED ORDER — TRAZODONE HCL 50 MG PO TABS
25.0000 mg | ORAL_TABLET | Freq: Two times a day (BID) | ORAL | Status: DC | PRN
  Administered 2019-10-09: 25 mg via ORAL
  Filled 2019-10-09: qty 1

## 2019-10-09 MED ORDER — SODIUM CHLORIDE 0.9 % IV SOLN
1.0000 g | INTRAVENOUS | Status: DC
  Administered 2019-10-10: 1 g via INTRAVENOUS
  Filled 2019-10-09: qty 10

## 2019-10-09 MED ORDER — PANTOPRAZOLE SODIUM 40 MG PO TBEC
40.0000 mg | DELAYED_RELEASE_TABLET | Freq: Every day | ORAL | Status: DC
  Administered 2019-10-09 – 2019-10-10 (×2): 40 mg via ORAL
  Filled 2019-10-09 (×3): qty 1

## 2019-10-09 MED ORDER — ATORVASTATIN CALCIUM 40 MG PO TABS
40.0000 mg | ORAL_TABLET | Freq: Every day | ORAL | Status: DC
  Administered 2019-10-09 – 2019-10-10 (×2): 40 mg via ORAL
  Filled 2019-10-09 (×3): qty 1

## 2019-10-09 NOTE — ED Provider Notes (Signed)
Patient seen/examined in the Emergency Department in conjunction with Advanced Practice Provider Sanders Patient had syncopal episode at nursing home Exam : awake/alert, appears confused but no acute distress Plan: imaging/labs pending at this time    Zadie Rhine, MD 10/09/19 [phone]

## 2019-10-09 NOTE — Progress Notes (Addendum)
[facility] PROGRESS NOTE    Progress Note  Amanda Owen  DTO:[id number] DOB: [birth year] DOA: 10/08/2019 PCP: Amanda Shanks, MD     Brief Narrative:   Amanda Owen is an 86 y.o. female past medical history significant for dementia, essential hypertension current admission [facility] for syncope no presents to the ED for evaluation for similar the ED vitals were stable, potassium 3.4 with a creatinine of 1.4 (baseline around 0.8 just a month ago) troponins unremarkable CBC unremarkable.  Assessment/Plan:   Recurrent syncope: Awaiting clear etiology there was loss of consciousness while setting, EKG shows line are 2-hour intervals are irregular we will repeat twelve-lead EKG to 500. Orthostatics pending Physical therapy pending Check  EEG, MRI brain, B12, TSH, RBC folate. Talking to the daughter she has advanced dementia, she has had episodes of recurrent syncope for the last 10 years.  Acute kidney injury: Unclear of her baseline creatinine.  Will try to obtain records from her PCP and skilled nursing facility.  Bacteriuria: There is no leukocytosis afebrile, she was a dose of Rocephin in the ED cultures are pending.  Prolonged QTC: Try to keep potassium greater than 4 magnesium greater than 2.  History of CVA: Continue Aggrenox and Lipitor.  Dementia with behavioral disturbances: Continue Namenda Celexa, Xanax and trazodone.  Essential hypertension: Continue metoprolol, continue to hold all other antihypertensive medication.  Elevated troponins: Troponins flat unlikely ACS.    DVT prophylaxis: lovenox Family [phone] daughter  Status is: Observation  The patient remains OBS appropriate and will d/c before 2 midnights.  Dispo: The patient is from: ALF              Anticipated d/c is to: SNF              Anticipated d/c date is: 1 day              Patient currently is not medically stable to d/c.  Code Status:     IV Access:     Peripheral IV   Procedures and diagnostic studies:   DG Chest 1 View  Result Date: 10/09/2019 CLINICAL DATA:  Syncope EXAM: CHEST  1 VIEW COMPARISON:  08/29/2019 FINDINGS: Lungs are clear.  No pleural effusion or pneumothorax. The heart is normal in size. IMPRESSION: No evidence of acute cardiopulmonary disease. Electronically Signed   By: Charline Bills M.D.   On: 10/09/2019 02:15   CT Head Wo Contrast  Result Date: 10/09/2019 CLINICAL DATA:  Encephalopathy. EXAM: CT HEAD WITHOUT CONTRAST TECHNIQUE: Contiguous axial images were obtained from the base of the skull through the vertex without intravenous contrast. COMPARISON:  Head CT 08/29/2019 at [facility] FINDINGS: Brain: No intracranial hemorrhage, mass effect, or midline shift. Stable degree of atrophy and chronic small vessel ischemia. No hydrocephalus. The basilar cisterns are patent. Bilateral basal gangliar mineralization. No evidence of territorial infarct or acute ischemia. Chronic linear calcification in the right frontal lobe. No extra-axial or intracranial fluid collection. Vascular: Atherosclerosis of skullbase vasculature without hyperdense vessel or abnormal calcification. Skull: No fracture or focal lesion. Sinuses/Orbits: Dysconjugate gaze, typically incidental. Left cataract resection. Paranasal sinuses and mastoid air cells are clear. The visualized orbits are unremarkable. Other: None. IMPRESSION: 1. No acute intracranial abnormality. 2. Stable atrophy and chronic small vessel ischemia. Electronically Signed   By: Narda Rutherford M.D.   On: 10/09/2019 01:55     Medical Consultants:    None.  Anti-Infectives:   None  Subjective:    Amanda Owen no  complaints, she is pleasantly confused.  Objective:    Vitals:   10/09/19 0100 10/09/19 0153 10/09/19 0800 10/09/19 0815  BP: (!) 143/44  (!) 168/56 (!) 140/47  Pulse: (!) 56  62 (!) 57  Resp: (!) 24  16 13   Temp:  (!) 96.4 F (35.8 C)    TempSrc:  Rectal      SpO2: 100%  100% 100%   SpO2: 100 % O2 Flow Rate (L/min): 10 L/min  No intake or output data in the 24 hours ending 10/09/19 0846 There were no vitals filed for this visit.  Exam: General exam: In no acute distress. Respiratory system: Good air movement and clear to auscultation. Cardiovascular system: S1 & S2 heard, RRR. No JVD. Gastrointestinal system: Abdomen is nondistended, soft and nontender.  Extremities: No pedal edema. Skin: No rashes, lesions or ulcers  Data Reviewed:    Labs: Basic Metabolic Panel: Recent Labs  Lab 10/09/19 0446  NA 138  K 3.4*  CL 104  CO2 23  GLUCOSE 98  BUN 26*  CREATININE 1.41*  CALCIUM 9.0   GFR CrCl cannot be calculated (Unknown ideal weight.). Liver Function Tests: Recent Labs  Lab 10/09/19 0446  AST 23  ALT 16  ALKPHOS 81  BILITOT 0.6  PROT 6.2*  ALBUMIN 2.9*   No results for input(s): LIPASE, AMYLASE in the last 168 hours. No results for input(s): AMMONIA in the last 168 hours. Coagulation profile No results for input(s): INR, PROTIME in the last 168 hours. COVID-19 Labs  No results for input(s): DDIMER, FERRITIN, LDH, CRP in the last 72 hours.  No results found for: SARSCOV2NAA  CBC: Recent Labs  Lab 10/09/19 0434  WBC 8.3  NEUTROABS 5.9  HGB 13.3  HCT 42.9  MCV 96.0  PLT 244   Cardiac Enzymes: No results for input(s): CKTOTAL, CKMB, CKMBINDEX, TROPONINI in the last 168 hours. BNP (last 3 results) No results for input(s): PROBNP in the last 8760 hours. CBG: No results for input(s): GLUCAP in the last 168 hours. D-Dimer: No results for input(s): DDIMER in the last 72 hours. Hgb A1c: No results for input(s): HGBA1C in the last 72 hours. Lipid Profile: No results for input(s): CHOL, HDL, LDLCALC, TRIG, CHOLHDL, LDLDIRECT in the last 72 hours. Thyroid function studies: No results for input(s): TSH, T4TOTAL, T3FREE, THYROIDAB in the last 72 hours.  Invalid input(s): FREET3 Anemia work up: No results  for input(s): VITAMINB12, FOLATE, FERRITIN, TIBC, IRON, RETICCTPCT in the last 72 hours. Sepsis Labs: Recent Labs  Lab 10/09/19 0434  WBC 8.3   Microbiology No results found for this or any previous visit (from the past 240 hour(s)).   Medications:   . metoprolol tartrate  25 mg Oral BID   Continuous Infusions: . 0.9 % NaCl with KCl 20 mEq / L    . magnesium sulfate bolus IVPB        LOS: 0 days   Charlynne Cousins  [facility]  10/09/2019, 8:46 AM

## 2019-10-09 NOTE — ED Notes (Signed)
Daughter Verdia Bolt [phone] updated on plan of care.  Requested admitting MD call her.  Daughter's contact information sent to Dr. Robb Matar.

## 2019-10-09 NOTE — Procedures (Signed)
Patient Name: Amanda Owen  MRN: [id number]  Epilepsy Attending: Charlsie Quest  Referring Physician/Provider: Dr Marinda Elk Date: 10/09/2019 Duration: 26.06 mins  Patient history: 86yo F with recurrent syncope. EEG to evaluate for seizure.   Level of alertness: Awake  AEDs during EEG study: None  Technical aspects: This EEG study was done with scalp electrodes positioned according to the 10-20 International system of electrode placement. Electrical activity was acquired at a sampling rate of 500Hz  and reviewed with a high frequency filter of 70Hz  and a low frequency filter of 1Hz . EEG data were recorded continuously and digitally stored.   Description: The posterior dominant rhythm consists of 9 Hz activity of moderate voltage (25-35 uV) seen predominantly in posterior head regions, symmetric and reactive to eye opening and eye closing. Hyperventilation and photic stimulation were not performed.     IMPRESSION: This study is within normal limits. No seizures or epileptiform discharges were seen throughout the recording.  Amanda Owen 

## 2019-10-09 NOTE — ED Notes (Signed)
Multiple attempts for IV unsuccessful.  IV team consult.

## 2019-10-09 NOTE — Progress Notes (Signed)
EEG complete - results pending 

## 2019-10-09 NOTE — ED Provider Notes (Signed)
[facility] EMERGENCY DEPARTMENT Provider Note   CSN: [id number] Arrival date & time: 10/08/19  2355     History Chief Complaint  Patient presents with  . ALOC    Amanda Owen is a 86 y.o. female.  The history is provided by the patient and medical records.   86 y.o. F with hx of CKD, depression, HLP, HTN, dementia, presenting to the ED for AMS.  Limited history from EMS.  Patient lives at [facility].  Reportedly she was in a chair today being wheeled down the hall, went unresponsive and was lowered to the floor.  She never lost pulses.  No trauma to the head/neck.Marland Kitchen  EMS was called and reported AMS but unknown baseline.  Patient denies pain.  She is pleasantly confused on my exam.    Past Medical History:  Diagnosis Date  . Chronic kidney failure   . Depression   . Hyperlipidemia   . Hypertension   . Memory deficit   . Occlusion of carotid artery   . Vitamin deficiency     Patient Active Problem List   Diagnosis Date Noted  . Chronic cough 07/31/2010    Past Surgical History:  Procedure Laterality Date  . ABDOMINAL HYSTERECTOMY       OB History   No obstetric history on file.     Family History  Problem Relation Age of Onset  . Heart disease Mother   . Heart disease Sister   . Heart disease Brother     Social History   Tobacco Use  . Smoking status: Never Smoker  Substance Use Topics  . Alcohol use: No  . Drug use: Not on file    Home Medications Prior to Admission medications   Medication Sig Start Date End Date Taking? Authorizing Provider  amLODipine (NORVASC) 5 MG tablet Take 5 mg by mouth daily.      [provider]  aspirin 81 MG tablet Take 81 mg by mouth daily.      [provider]  citalopram (CELEXA) 20 MG tablet Take 20 mg by mouth daily.      [provider]  clopidogrel (PLAVIX) 75 MG tablet Take 75 mg by mouth daily.      [provider]  dipyridamole-aspirin (AGGRENOX) 200-25 MG  per 12 hr capsule Take 1 capsule by mouth 2 (two) times daily.    [provider]  donepezil (ARICEPT) 10 MG tablet Take 10 mg by mouth at bedtime as needed.      [provider]  HYDROcodone-acetaminophen (NORCO/VICODIN) 5-325 MG per tablet Take 1 tablet by mouth every 6 (six) hours as needed for moderate pain or severe pain. 02/26/14   Mellody Drown, PA-C  memantine (NAMENDA) 10 MG tablet Take 10 mg by mouth 2 (two) times daily.      [provider]  omeprazole (PRILOSEC) 40 MG capsule Take 1 capsule (40 mg total) by mouth 2 (two) times daily. 07/31/10   Clance, Maree Krabbe, MD  predniSONE (DELTASONE) 10 MG tablet Take 4 tabs by mouth x 2 days, 2 tabs by mouth for 2 days, 1 tab by mouth x 2 days then stop 08/29/10   Barbaraann Share, MD  simvastatin (ZOCOR) 80 MG tablet Take 80 mg by mouth at bedtime.      [provider]    Allergies    Patient has no known allergies.  Review of Systems   Review of Systems  Unable to perform ROS: Dementia  Physical Exam Updated Vital Signs BP (!) 164/80 (BP Location: Right Arm)   Pulse (!) 59   Resp 16   Physical Exam Vitals and nursing note reviewed.  Constitutional:      Appearance: She is well-developed.     Comments: Awake, fidgeting  HENT:     Head: Normocephalic and atraumatic.  Eyes:     Conjunctiva/sclera: Conjunctivae normal.     Pupils: Pupils are equal, round, and reactive to light.  Cardiovascular:     Rate and Rhythm: Normal rate and regular rhythm.     Heart sounds: Normal heart sounds.  Pulmonary:     Effort: Pulmonary effort is normal.     Breath sounds: Normal breath sounds.  Abdominal:     General: Bowel sounds are normal.     Palpations: Abdomen is soft.  Musculoskeletal:        General: Normal range of motion.     Cervical back: Normal range of motion.  Skin:    General: Skin is warm and dry.  Neurological:     Mental Status: She is alert.     Comments: Awake, alert, oriented to  self, follows limited commands and answers limited questions, seems confused (unsure if worse than baseline)     ED Results / Procedures / Treatments   Labs (all labs ordered are listed, but only abnormal results are displayed) Labs Reviewed  URINALYSIS, ROUTINE W REFLEX MICROSCOPIC - Abnormal; Notable for the following components:      Result Value   APPearance TURBID (*)    Hgb urine dipstick MODERATE (*)    Ketones, ur 5 (*)    Protein, ur 100 (*)    Leukocytes,Ua SMALL (*)    All other components within normal limits  COMPREHENSIVE METABOLIC PANEL - Abnormal; Notable for the following components:   Potassium 3.4 (*)    BUN 26 (*)    Creatinine, Ser 1.41 (*)    Total Protein 6.2 (*)    Albumin 2.9 (*)    GFR calc non Af Amer 32 (*)    GFR calc Af Amer 37 (*)    All other components within normal limits  URINALYSIS, MICROSCOPIC (REFLEX) - Abnormal; Notable for the following components:   Bacteria, UA MANY (*)    All other components within normal limits  CBC WITH DIFFERENTIAL/PLATELET - Abnormal; Notable for the following components:   RDW 16.2 (*)    All other components within normal limits  TROPONIN I (HIGH SENSITIVITY) - Abnormal; Notable for the following components:   Troponin I (High Sensitivity) 24 (*)    All other components within normal limits  URINE CULTURE  SARS CORONAVIRUS 2 BY RT PCR (HOSPITAL ORDER, PERFORMED IN [facility])  CBC WITH DIFFERENTIAL/PLATELET    EKG EKG Interpretation  Date/Time:  Friday October 08 2019 23:57:42 EDT Ventricular Rate:  54 PR Interval:    QRS Duration: 128 QT Interval:  631 QTC Calculation: 599 R Axis:   14 Text Interpretation: Sinus rhythm indeterminate rhythm Probable left ventricular hypertrophy Nonspecific T abnormalities, lateral leads Prolonged QT interval Interpretation limited secondary to artifact Confirmed by Zadie Rhine ([id number]) on 10/09/2019 12:57:18 AM   Radiology DG Chest 1 View  Result  Date: 10/09/2019 CLINICAL DATA:  Syncope EXAM: CHEST  1 VIEW COMPARISON:  08/29/2019 FINDINGS: Lungs are clear.  No pleural effusion or pneumothorax. The heart is normal in size. IMPRESSION: No evidence of acute cardiopulmonary disease. Electronically Signed   By: Roselie Awkward.D.  On: 10/09/2019 02:15   CT Head Wo Contrast  Result Date: 10/09/2019 CLINICAL DATA:  Encephalopathy. EXAM: CT HEAD WITHOUT CONTRAST TECHNIQUE: Contiguous axial images were obtained from the base of the skull through the vertex without intravenous contrast. COMPARISON:  Head CT 08/29/2019 at [place]: Brain: No intracranial hemorrhage, mass effect, or midline shift. Stable degree of atrophy and chronic small vessel ischemia. No hydrocephalus. The basilar cisterns are patent. Bilateral basal gangliar mineralization. No evidence of territorial infarct or acute ischemia. Chronic linear calcification in the right frontal lobe. No extra-axial or intracranial fluid collection. Vascular: Atherosclerosis of skullbase vasculature without hyperdense vessel or abnormal calcification. Skull: No fracture or focal lesion. Sinuses/Orbits: Dysconjugate gaze, typically incidental. Left cataract resection. Paranasal sinuses and mastoid air cells are clear. The visualized orbits are unremarkable. Other: None. IMPRESSION: 1. No acute intracranial abnormality. 2. Stable atrophy and chronic small vessel ischemia. Electronically Signed   By: Keith Rake M.D.   On: 10/09/2019 01:55    Procedures Procedures (including critical care time)  Medications Ordered in ED Medications  cefTRIAXone (ROCEPHIN) 1 g in sodium chloride 0.9 % 100 mL IVPB (1 g Intravenous New Bag/Given 10/09/19 0422)    ED Course  I have reviewed the triage vital signs and the nursing notes.  Pertinent labs & imaging results that were available during my care of the patient were reviewed by me and considered in my medical decision making (see chart for  details).    MDM Rules/Calculators/A&P  86 y.o. F presenting to the ED from SNF for AMS.  Staff reported syncopal event today while in wheelchair, no trauma reported.  Patient is afebrile, non-toxic.  She does appear confused but is able to follow simple commands and answer simple questions.  She denies any current pain.  She is currently on aggrenox and ASA.  Will obtains labs, CXR, CT Head.  EKG without acute ischemic findings.  2:47 AM [facility] place x4 without answer, unable to obtain further details about events of today.  Attempted to call next of kin, Daughter [name], for information about patient's baseline mental status at number listed on paperwork, no answer.  UA does appear infectious, will give dose of IV Rocephin.  Waiting for IV team to assist with blood draw.  5:08 AM Difficulty obtaining blood but phlebotomy finally able to obtain-- results pending.  CT Head without acute findings.  CXR clear.  I have attempted to call daughter again, still no answer.    5:39 AM Labs as above-- CKD appears around her baseline.  No significant leukocytosis.  Her troponin is mildly elevated at 24.  She continues to deny chest pain.  Given her syncope, will admit for ongoing care and trending enzymes though doubtful this represents ACS, PE, dissection, etc.  Discussed with hospitalist, Dr. Myna Hidalgo-- will admit for ongoing care.  Final Clinical Impression(s) / ED Diagnoses Final diagnoses:  Altered mental status, unspecified altered mental status type  Acute cystitis without hematuria  Elevated troponin  Syncope, unspecified syncope type    Rx / DC Orders ED Discharge Orders    None       Larene Pickett, PA-C 10/09/19 [id number]    Ripley Fraise, MD 10/09/19 [phone]

## 2019-10-09 NOTE — H&P (Signed)
History and Physical    Amanda Owen RWE:[id number] DOB: 10/08/1922 DOA: 10/08/2019  PCP: Charlesetta Shanks, MD   Patient coming from: SNF   Chief Complaint: Syncope   HPI: Amanda Owen is a 86 y.o. female with medical history significant for dementia, anxiety, hypertension, history of CVA, and recurrent admissions to [facility] recently for syncope and now presenting to the ED for evaluation of another syncopal episode at her SNF.  Patient was discharged from an outside hospital just over a month ago after recurrent syncopal episode and is reported to have had normal echocardiogram during that admission with etiology suspected to be orthostasis.  She was discharged to an SNF after that recent admission and was said to be seated in her wheelchair today with staff when she suffered a transient loss of consciousness.  Patient denies any chest pain or palpitations, denies leg swelling or tenderness, and denies any shortness of breath.  She is oriented to person, knows that she is in the hospital, is able to tell me about her daughter and growing up in [place], but does not remember where she lives currently and is not aware of the month or year.  Multiple attempts were made to reach her daughter for additional history but these were unsuccessful.  ED Course: Upon arrival to the ED, patient is found to have a temperature of 35.8 C rectally, saturating 100% on supplemental oxygen, heart rate in the mid 50s, and blood pressure 115/87.  EKG features undetermined rhythm with QTc interval of 599 ms.  Chest x-rays negative for acute cardiopulmonary disease.  Noncontrast head CT is negative for acute intracranial abnormality.  Chemistry panel is notable for potassium 3.4 and creatinine 1.41, up from 0.82 just over a month ago.  CBC is unremarkable.  Troponin is slightly elevated.  Urinalysis with bacteriuria.  Urine was sent for culture and the patient was given a dose of Rocephin in the ED.  Review of Systems:    ROS is limited by the patient's clinical condition with dementia.  Past Medical History:  Diagnosis Date  . Chronic kidney failure   . Depression   . Hyperlipidemia   . Hypertension   . Memory deficit   . Occlusion of carotid artery   . Vitamin deficiency     Past Surgical History:  Procedure Laterality Date  . ABDOMINAL HYSTERECTOMY       reports that she has never smoked. She does not have any smokeless tobacco history on file. She reports that she does not drink alcohol. No history on file for drug.  No Known Allergies  Family History  Problem Relation Age of Onset  . Heart disease Mother   . Heart disease Sister   . Heart disease Brother      Prior to Admission medications   Medication Sig Start Date End Date Taking? Authorizing Provider  ALPRAZolam (XANAX) 0.25 MG tablet Take 0.25 mg by mouth every 8 (eight) hours as needed for anxiety.   Yes [provider]  aspirin 81 MG chewable tablet Chew 81 mg by mouth daily.   Yes [provider]  atorvastatin (LIPITOR) 40 MG tablet Take 40 mg by mouth daily. 08/27/19  Yes [provider]  citalopram (CELEXA) 20 MG tablet Take 20 mg by mouth daily.     Yes [provider]  dipyridamole-aspirin (AGGRENOX) 200-25 MG per 12 hr capsule Take 1 capsule by mouth 2 (two) times daily.   Yes [provider]  memantine (NAMENDA XR) 28  MG CP24 24 hr capsule Take 28 mg by mouth daily.   Yes [provider]  metoprolol tartrate (LOPRESSOR) 25 MG tablet Take 25 mg by mouth 2 (two) times daily.   Yes [provider]  omeprazole (PRILOSEC) 20 MG capsule Take 20 mg by mouth daily.   Yes [provider]  traZODone (DESYREL) 50 MG tablet Take 25-50 mg by mouth See admin instructions. Take 1/2 tablet twice a day as needed for agitation and 1 tablet at bedtime.   Yes [provider]    Physical Exam: Vitals:   10/09/19 0030 10/09/19 0045 10/09/19 0100 10/09/19 0153   BP: (!) 140/57 115/87 (!) 143/44   Pulse:  (!) 58 (!) 56   Resp: 15 (!) 21 (!) 24   Temp:    (!) 96.4 F (35.8 C)  TempSrc:    Rectal  SpO2:  100% 100%     Constitutional: NAD, calm  Eyes: PERTLA, lids and conjunctivae normal ENMT: Mucous membranes are moist. Posterior pharynx clear of any exudate or lesions.   Neck: normal, supple, no masses, no thyromegaly Respiratory: no wheezing, no crackles. No accessory muscle use.  Cardiovascular: S1 & S2 heard, regular rate and rhythm. No extremity edema.  Abdomen: No distension, no tenderness, soft. Bowel sounds active.  Musculoskeletal: no clubbing / cyanosis. No joint deformity upper and lower extremities.   Skin: no significant rashes, lesions, ulcers. Warm, dry, well-perfused. Neurologic: right eye palsy, CN II-XII grossly intact otherwise. Sensation to light touch intact. Moving all extremities.  Psychiatric: Alert and oriented to person and situation only. Very pleasant and cooperative.    Labs and Imaging on Admission: I have personally reviewed following labs and imaging studies  CBC: Recent Labs  Lab 10/09/19 0434  WBC 8.3  NEUTROABS 5.9  HGB 13.3  HCT 42.9  MCV 96.0  PLT 244   Basic Metabolic Panel: Recent Labs  Lab 10/09/19 0446  NA 138  K 3.4*  CL 104  CO2 23  GLUCOSE 98  BUN 26*  CREATININE 1.41*  CALCIUM 9.0   GFR: CrCl cannot be calculated (Unknown ideal weight.). Liver Function Tests: Recent Labs  Lab 10/09/19 0446  AST 23  ALT 16  ALKPHOS 81  BILITOT 0.6  PROT 6.2*  ALBUMIN 2.9*   No results for input(s): LIPASE, AMYLASE in the last 168 hours. No results for input(s): AMMONIA in the last 168 hours. Coagulation Profile: No results for input(s): INR, PROTIME in the last 168 hours. Cardiac Enzymes: No results for input(s): CKTOTAL, CKMB, CKMBINDEX, TROPONINI in the last 168 hours. BNP (last 3 results) No results for input(s): PROBNP in the last 8760 hours. HbA1C: No results for input(s):  HGBA1C in the last 72 hours. CBG: No results for input(s): GLUCAP in the last 168 hours. Lipid Profile: No results for input(s): CHOL, HDL, LDLCALC, TRIG, CHOLHDL, LDLDIRECT in the last 72 hours. Thyroid Function Tests: No results for input(s): TSH, T4TOTAL, FREET4, T3FREE, THYROIDAB in the last 72 hours. Anemia Panel: No results for input(s): VITAMINB12, FOLATE, FERRITIN, TIBC, IRON, RETICCTPCT in the last 72 hours. Urine analysis:    Component Value Date/Time   COLORURINE YELLOW 10/09/2019 0013   APPEARANCEUR TURBID (A) 10/09/2019 0013   LABSPEC 1.019 10/09/2019 0013   PHURINE 5.0 10/09/2019 0013   GLUCOSEU NEGATIVE 10/09/2019 0013   HGBUR MODERATE (A) 10/09/2019 0013   BILIRUBINUR NEGATIVE 10/09/2019 0013   KETONESUR 5 (A) 10/09/2019 0013   PROTEINUR 100 (A) 10/09/2019 0013   UROBILINOGEN  1.0 09/19/2010 1709   NITRITE NEGATIVE 10/09/2019 0013   LEUKOCYTESUR SMALL (A) 10/09/2019 0013   Sepsis Labs: @LABRCNTIP (procalcitonin:4,lacticidven:4) )No results found for this or any previous visit (from the past 240 hour(s)).   Radiological Exams on Admission: DG Chest 1 View  Result Date: 10/09/2019 CLINICAL DATA:  Syncope EXAM: CHEST  1 VIEW COMPARISON:  08/29/2019 FINDINGS: Lungs are clear.  No pleural effusion or pneumothorax. The heart is normal in size. IMPRESSION: No evidence of acute cardiopulmonary disease. Electronically Signed   By: 08/31/2019 M.D.   On: 10/09/2019 02:15   CT Head Wo Contrast  Result Date: 10/09/2019 CLINICAL DATA:  Encephalopathy. EXAM: CT HEAD WITHOUT CONTRAST TECHNIQUE: Contiguous axial images were obtained from the base of the skull through the vertex without intravenous contrast. COMPARISON:  Head CT 08/29/2019 at [facility] FINDINGS: Brain: No intracranial hemorrhage, mass effect, or midline shift. Stable degree of atrophy and chronic small vessel ischemia. No hydrocephalus. The basilar cisterns are patent. Bilateral basal gangliar mineralization. No  evidence of territorial infarct or acute ischemia. Chronic linear calcification in the right frontal lobe. No extra-axial or intracranial fluid collection. Vascular: Atherosclerosis of skullbase vasculature without hyperdense vessel or abnormal calcification. Skull: No fracture or focal lesion. Sinuses/Orbits: Dysconjugate gaze, typically incidental. Left cataract resection. Paranasal sinuses and mastoid air cells are clear. The visualized orbits are unremarkable. Other: None. IMPRESSION: 1. No acute intracranial abnormality. 2. Stable atrophy and chronic small vessel ischemia. Electronically Signed   By: [facility] M.D.   On: 10/09/2019 01:55    EKG: Independently reviewed. Undetermined rhythm, QTc 599 ms.   Assessment/Plan   1. Syncope  - Presents from her SNF where she had a syncopal episode while seated in wheelchair  - She has had recent admissions to [facility] for syncope, reportedly had normal echo just over a month ago there, and etiology was suspected to be orthostasis though the current episode occurred while seated  - EKG with undetermined rhythm and QTc of 599 ms  - Continue cardiac monitoring, check orthostatic vitals, continue supportive care, fall precautions    2. AKI  - SCr is 1.41 in ED, up from 0.82 on September 03, 2019  - Check FENa, hydrate with IVF, renally-dose medications, repeat chem panel in am   3. Bacteruria  - Urine was sent for culture and Rocephin started in ED  - There is no leukocytosis or clear symptoms but patient has rectal temp <36 C and treatment will be continued pending culture data    4. Prolonged QT interval  - Continue cardiac monitoring, replace potassium to 4 and magnesium to 2, minimize QT-prolonging medications, and repeat EKG in am    5. History of CVA  - No acute findings on head CT  - Continue Aggrenox and Lipitor    6. Dementia with behavioral disturbance; anxiety  - She is pleasant and cooperative in ED  - Continue Namenda, Celexa, Xanax,  trazodone    7. Hypertension  - Continue metoprolol with holding parameters    8. Elevated troponin  - Troponin is 24 in ED without any anginal complaints  - Low-suspicion for ACS, check delta trop and continue Lipitor and ASA    DVT prophylaxis: sq heparin  Code Status: DNR confirmed with patient and documentation Family Communication: Discussed with patient, daughter could not be reached  Disposition Plan:  Patient is from: SNF  Anticipated d/c is to: SNF  Anticipated d/c date is: 10/10/19 Patient currently: Pending evaluation of recurrent syncope  with orthostatic vitals and cardiac monitoring  Consults called: None  Admission status: Observation     Vianne Bulls, MD [facility] Pager: See [email or web address]  If 7AM-7PM, please contact the daytime attending [email or web address]  10/09/2019, 6:26 AM

## 2019-10-09 NOTE — ED Notes (Signed)
RN at bedside attempting to restart IV. MRI is ready for pt.  Will call them once IV restarted.

## 2019-10-09 NOTE — ED Notes (Signed)
IV team at bedside 

## 2019-10-10 DIAGNOSIS — R8271 Bacteriuria: Secondary | ICD-10-CM | POA: Diagnosis present

## 2019-10-10 DIAGNOSIS — G9341 Metabolic encephalopathy: Secondary | ICD-10-CM | POA: Diagnosis not present

## 2019-10-10 DIAGNOSIS — E876 Hypokalemia: Secondary | ICD-10-CM | POA: Diagnosis present

## 2019-10-10 DIAGNOSIS — I129 Hypertensive chronic kidney disease with stage 1 through stage 4 chronic kidney disease, or unspecified chronic kidney disease: Secondary | ICD-10-CM | POA: Diagnosis present

## 2019-10-10 DIAGNOSIS — Z8249 Family history of ischemic heart disease and other diseases of the circulatory system: Secondary | ICD-10-CM | POA: Diagnosis not present

## 2019-10-10 DIAGNOSIS — I951 Orthostatic hypotension: Secondary | ICD-10-CM | POA: Diagnosis present

## 2019-10-10 DIAGNOSIS — Z8673 Personal history of transient ischemic attack (TIA), and cerebral infarction without residual deficits: Secondary | ICD-10-CM | POA: Diagnosis not present

## 2019-10-10 DIAGNOSIS — Z66 Do not resuscitate: Secondary | ICD-10-CM | POA: Diagnosis present

## 2019-10-10 DIAGNOSIS — Z7982 Long term (current) use of aspirin: Secondary | ICD-10-CM | POA: Diagnosis not present

## 2019-10-10 DIAGNOSIS — E861 Hypovolemia: Secondary | ICD-10-CM | POA: Diagnosis present

## 2019-10-10 DIAGNOSIS — F419 Anxiety disorder, unspecified: Secondary | ICD-10-CM | POA: Diagnosis present

## 2019-10-10 DIAGNOSIS — F329 Major depressive disorder, single episode, unspecified: Secondary | ICD-10-CM | POA: Diagnosis present

## 2019-10-10 DIAGNOSIS — Z79899 Other long term (current) drug therapy: Secondary | ICD-10-CM | POA: Diagnosis not present

## 2019-10-10 DIAGNOSIS — Z7902 Long term (current) use of antithrombotics/antiplatelets: Secondary | ICD-10-CM | POA: Diagnosis not present

## 2019-10-10 DIAGNOSIS — Z90710 Acquired absence of both cervix and uterus: Secondary | ICD-10-CM | POA: Diagnosis not present

## 2019-10-10 DIAGNOSIS — I4581 Long QT syndrome: Secondary | ICD-10-CM | POA: Diagnosis present

## 2019-10-10 DIAGNOSIS — F0391 Unspecified dementia with behavioral disturbance: Secondary | ICD-10-CM | POA: Diagnosis present

## 2019-10-10 DIAGNOSIS — I10 Essential (primary) hypertension: Secondary | ICD-10-CM | POA: Diagnosis not present

## 2019-10-10 DIAGNOSIS — Z20822 Contact with and (suspected) exposure to covid-19: Secondary | ICD-10-CM | POA: Diagnosis present

## 2019-10-10 DIAGNOSIS — R778 Other specified abnormalities of plasma proteins: Secondary | ICD-10-CM | POA: Diagnosis present

## 2019-10-10 DIAGNOSIS — N189 Chronic kidney disease, unspecified: Secondary | ICD-10-CM | POA: Diagnosis present

## 2019-10-10 DIAGNOSIS — E785 Hyperlipidemia, unspecified: Secondary | ICD-10-CM | POA: Diagnosis present

## 2019-10-10 DIAGNOSIS — N179 Acute kidney failure, unspecified: Secondary | ICD-10-CM | POA: Diagnosis present

## 2019-10-10 LAB — CBC
HCT: 37.4 % (ref 36.0–46.0)
Hemoglobin: 11.8 g/dL — ABNORMAL LOW (ref 12.0–15.0)
MCH: 29.5 pg (ref 26.0–34.0)
MCHC: 31.6 g/dL (ref 30.0–36.0)
MCV: 93.5 fL (ref 80.0–100.0)
Platelets: 254 10*3/uL (ref 150–400)
RBC: 4 MIL/uL (ref 3.87–5.11)
RDW: 16.1 % — ABNORMAL HIGH (ref 11.5–15.5)
WBC: 8.3 10*3/uL (ref 4.0–10.5)
nRBC: 0 % (ref 0.0–0.2)

## 2019-10-10 LAB — BASIC METABOLIC PANEL
Anion gap: 11 (ref 5–15)
BUN: 18 mg/dL (ref 8–23)
CO2: 24 mmol/L (ref 22–32)
Calcium: 8.7 mg/dL — ABNORMAL LOW (ref 8.9–10.3)
Chloride: 108 mmol/L (ref 98–111)
Creatinine, Ser: 1.17 mg/dL — ABNORMAL HIGH (ref 0.44–1.00)
GFR calc Af Amer: 46 mL/min — ABNORMAL LOW (ref >60)
GFR calc non Af Amer: 40 mL/min — ABNORMAL LOW (ref >60)
Glucose, Bld: 94 mg/dL (ref 70–99)
Potassium: 3.7 mmol/L (ref 3.5–5.1)
Sodium: 143 mmol/L (ref 135–145)

## 2019-10-10 LAB — GLUCOSE, CAPILLARY
Glucose-Capillary: 90 mg/dL (ref 70–99)
Glucose-Capillary: 76 mg/dL (ref 70–99)
Glucose-Capillary: 82 mg/dL (ref 70–99)
Glucose-Capillary: 76 mg/dL (ref 70–99)

## 2019-10-10 MED ORDER — SODIUM CHLORIDE 0.9 % IV SOLN: INTRAVENOUS | Status: DC

## 2019-10-10 MED ORDER — HYDRALAZINE HCL 25 MG PO TABS
25.0000 mg | ORAL_TABLET | Freq: Four times a day (QID) | ORAL | Status: DC | PRN
  Administered 2019-10-10 (×2): 25 mg via ORAL
  Filled 2019-10-10 (×2): qty 1

## 2019-10-10 NOTE — Progress Notes (Signed)
[facility] PROGRESS NOTE    Progress Note  Amanda Owen  OZD:[id number] DOB: 1922-08-11 DOA: 10/08/2019 PCP: Charlesetta Shanks, MD     Brief Narrative:   Amanda Owen is an 86 y.o. female past medical history significant for dementia, essential hypertension current admission [facility] for syncope no presents to the ED for evaluation for similar the ED vitals were stable, potassium 3.4 with a creatinine of 1.4 (baseline around 0.8 just a month ago) troponins unremarkable CBC unremarkable.  Assessment/Plan:   Recurrent syncope: EKG shows no signs of ischemia 12-lead EKG showed a QTC of 500 Orthostatics pending Physical therapy pending MRI was a poor exam basically no acute findings. EEG showed no signs of seizures. B12 400, TSH 1.7. Creatinine was up on admission which points towards prerenal azotemia, orthostatics still not been checked. Question of a recurrent syncope is likely due to hypovolemia.  Discontinue Xanax as this could cause falls in elderly patients.  Acute kidney injury: Patient is possibly less than 1 on admission 1.4 and has improved to 1.1 with IV fluid hydration continue IV fluids for an additional 24 hours recheck a basic metabolic panel tomorrow morning.  Asymptomatic bacteriuria: Urine cultures are probably can to be negative discontinue IV Rocephin  Prolonged QTC: Try to keep potassium greater than 4 magnesium greater than 2.  History of CVA: Continue Aggrenox and Lipitor.  Dementia with behavioral disturbances: Continue Namenda Celexa,  and trazodone. Discontinue Xanax high risk of falls.  Essential hypertension: Continue metoprolol. She will have to go home only on metoprolol.  Elevated troponins: Troponins flat unlikely ACS.    DVT prophylaxis: lovenox Family [phone] daughter try to call daughter 3 times today and have been unsuccessful.  Status is: Observation  The patient remains OBS appropriate and  will d/c before 2 midnights.  Dispo: The patient is from: ALF              Anticipated d/c is to: SNF              Anticipated d/c date is: 1 day              Patient currently ismedically stable to d/c.  Code Status:     IV Access:    Peripheral IV   Procedures and diagnostic studies:   DG Chest 1 View  Result Date: 10/09/2019 CLINICAL DATA:  Syncope EXAM: CHEST  1 VIEW COMPARISON:  08/29/2019 FINDINGS: Lungs are clear.  No pleural effusion or pneumothorax. The heart is normal in size. IMPRESSION: No evidence of acute cardiopulmonary disease. Electronically Signed   By: Charline Bills M.D.   On: 10/09/2019 02:15   CT Head Wo Contrast  Result Date: 10/09/2019 CLINICAL DATA:  Encephalopathy. EXAM: CT HEAD WITHOUT CONTRAST TECHNIQUE: Contiguous axial images were obtained from the base of the skull through the vertex without intravenous contrast. COMPARISON:  Head CT 08/29/2019 at [facility] FINDINGS: Brain: No intracranial hemorrhage, mass effect, or midline shift. Stable degree of atrophy and chronic small vessel ischemia. No hydrocephalus. The basilar cisterns are patent. Bilateral basal gangliar mineralization. No evidence of territorial infarct or acute ischemia. Chronic linear calcification in the right frontal lobe. No extra-axial or intracranial fluid collection. Vascular: Atherosclerosis of skullbase vasculature without hyperdense vessel or abnormal calcification. Skull: No fracture or focal lesion. Sinuses/Orbits: Dysconjugate gaze, typically incidental. Left cataract resection. Paranasal sinuses and mastoid air cells are clear. The visualized orbits are unremarkable. Other: None. IMPRESSION: 1. No acute intracranial abnormality. 2. Stable atrophy and  chronic small vessel ischemia. Electronically Signed   By: Narda Rutherford M.D.   On: 10/09/2019 01:55   MR BRAIN WO CONTRAST  Result Date: 10/09/2019 CLINICAL DATA:  Encephalopathy. Additional history provided: Dementia, altered  mental status, recent fall. EXAM: MRI HEAD WITHOUT CONTRAST TECHNIQUE: Multiplanar, multiecho pulse sequences of the brain and surrounding structures were obtained without intravenous contrast. COMPARISON:  Noncontrast head CT 10/09/2019 FINDINGS: Brain: The patient was unable to tolerate the examination. As a result, only axial and coronal diffusion-weighted imaging, a sagittal T1 weighted sequence, axial T2 weighted sequence and a coronal T2 weighted sequence could be obtained. The axial diffusion-weighted sequence is of good quality. There is moderate motion degradation of the coronal diffusion-weighted sequence. The remainder of the examination is severely motion degraded and nondiagnostic. There is a punctate focus of apparent restricted diffusion within the paramedian posterior left frontal lobe which may reflect a tiny acute infarct or artifact (series 3, image 40). Vascular: Poorly assessed due to motion degradation. Skull and upper cervical spine: Poorly assessed due to motion degradation. Sinuses/Orbits: Poorly assessed due to motion degradation. IMPRESSION: Significantly motion degraded, limited and prematurely terminated examination as described. With the exception of the diffusion-weighted imaging, the examination is nondiagnostic. Punctate focus of apparent restricted diffusion within the paramedian posterior left frontal lobe, which may reflect a tiny acute infarct or artifact. Electronically Signed   By: Jackey Loge DO   On: 10/09/2019 16:21   EEG adult  Result Date: 10/09/2019 Charlsie Quest, MD     10/09/2019  4:23 PM Patient Name: Amanda Owen MRN: [id number] Epilepsy Attending: Charlsie Quest Referring Physician/Provider: Dr Marinda Elk Date: 10/09/2019 Duration: 26.06 mins Patient history: 86yo F with recurrent syncope. EEG to evaluate for seizure. Level of alertness: Awake AEDs during EEG study: None Technical aspects: This EEG study was done with scalp electrodes positioned  according to the 10-20 International system of electrode placement. Electrical activity was acquired at a sampling rate of 500Hz  and reviewed with a high frequency filter of 70Hz  and a low frequency filter of 1Hz . EEG data were recorded continuously and digitally stored. Description: The posterior dominant rhythm consists of 9 Hz activity of moderate voltage (25-35 uV) seen predominantly in posterior head regions, symmetric and reactive to eye opening and eye closing. Hyperventilation and photic stimulation were not performed.   IMPRESSION: This study is within normal limits. No seizures or epileptiform discharges were seen throughout the recording.     Medical Consultants:    None.  Anti-Infectives:   None  Subjective:    pleasantly confused this morning.  Objective:    Vitals:   10/09/19 2232 10/10/19 0046 10/10/19 0425 10/10/19 0804  BP: (!) 151/71 (!) 155/61 (!) 130/94 (!) 193/78  Pulse: 74 61 73 80  Resp:  13 14 20   Temp:  98.5 F (36.9 C) 98.8 F (37.1 C) 99.1 F (37.3 C)  TempSrc:  Axillary Axillary Axillary  SpO2:  96% 94% 94%  Weight:   67.8 kg    SpO2: 94 % O2 Flow Rate (L/min): 10 L/min   Intake/Output Summary (Last 24 hours) at 10/10/2019 1027 Last data filed at 10/09/2019 2232 Gross per 24 hour  Intake 3 ml  Output --  Net 3 ml   Filed Weights   10/09/19 1231 10/10/19 0425  Weight: 65.7 kg 67.8 kg    Exam: General exam: In no acute distress. Respiratory system: Good air movement and clear to auscultation. Cardiovascular system:  S1 & S2 heard, RRR. No JVD. Gastrointestinal system: Abdomen is nondistended, soft and nontender.  Extremities: No pedal edema. Skin: No rashes, lesions or ulcers  Data Reviewed:    Labs: Basic Metabolic Panel: Recent Labs  Lab 10/09/19 0446 10/10/19 0255  NA 138 143  K 3.4* 3.7  CL 104 108  CO2 23 24  GLUCOSE 98 94  BUN 26* 18  CREATININE 1.41* 1.17*  CALCIUM 9.0 8.7*   GFR CrCl  cannot be calculated (Unknown ideal weight.). Liver Function Tests: Recent Labs  Lab 10/09/19 0446  AST 23  ALT 16  ALKPHOS 81  BILITOT 0.6  PROT 6.2*  ALBUMIN 2.9*   No results for input(s): LIPASE, AMYLASE in the last 168 hours. No results for input(s): AMMONIA in the last 168 hours. Coagulation profile No results for input(s): INR, PROTIME in the last 168 hours. COVID-19 Labs  No results for input(s): DDIMER, FERRITIN, LDH, CRP in the last 72 hours.  Lab Results  Component Value Date   [place] NEGATIVE 10/09/2019    CBC: Recent Labs  Lab 10/09/19 0434 10/10/19 0255  WBC 8.3 8.3  NEUTROABS 5.9  --   HGB 13.3 11.8*  HCT 42.9 37.4  MCV 96.0 93.5  PLT 244 254   Cardiac Enzymes: No results for input(s): CKTOTAL, CKMB, CKMBINDEX, TROPONINI in the last 168 hours. BNP (last 3 results) No results for input(s): PROBNP in the last 8760 hours. CBG: Recent Labs  Lab 10/09/19 1314 10/09/19 1425 10/09/19 1640 10/09/19 2131 10/10/19 0801  GLUCAP 69* 117* 101* 79 90   D-Dimer: No results for input(s): DDIMER in the last 72 hours. Hgb A1c: No results for input(s): HGBA1C in the last 72 hours. Lipid Profile: No results for input(s): CHOL, HDL, LDLCALC, TRIG, CHOLHDL, LDLDIRECT in the last 72 hours. Thyroid function studies: Recent Labs    10/09/19 0950  TSH 1.758   Anemia work up: Recent Labs    10/09/19 0843  VITAMINB12 426   Sepsis Labs: Recent Labs  Lab 10/09/19 0434 10/10/19 0255  WBC 8.3 8.3   Microbiology Recent Results (from the past 240 hour(s))  Urine culture     Status: Abnormal (Preliminary result)   Collection Time: 10/09/19 12:13 AM   Specimen: Urine, Catheterized  Result Value Ref Range Status   Specimen Description URINE, CATHETERIZED  Final   Special Requests   Final    NONE Performed at [facility], [address] [place], [place] [address]    Culture >=100,000 COLONIES/mL GRAM NEGATIVE RODS (A)  Final   Report  Status PENDING  Incomplete  SARS Coronavirus 2 by RT PCR (hospital order, performed in [facility] hospital lab) Nasopharyngeal Nasopharyngeal Swab     Status: None   Collection Time: 10/09/19  7:49 AM   Specimen: Nasopharyngeal Swab  Result Value Ref Range Status   SARS Coronavirus 2 NEGATIVE NEGATIVE Final    Comment: (NOTE) SARS-CoV-2 target nucleic acids are NOT DETECTED. The SARS-CoV-2 RNA is generally detectable in upper and lower respiratory specimens during the acute phase of infection. The lowest concentration of SARS-CoV-2 viral copies this assay can detect is 250 copies / mL. A negative result does not preclude SARS-CoV-2 infection and should not be used as the sole basis for treatment or other patient management decisions.  A negative result may occur with improper specimen collection / handling, submission of specimen other than nasopharyngeal swab, presence of viral mutation(s) within the areas targeted by this assay, and inadequate number  of viral copies (<250 copies / mL). A negative result must be combined with clinical observations, patient history, and epidemiological information. Fact Sheet for Patients:   BoilerBrush.com.cy Fact Sheet for Healthcare Providers: https://pope.com/ This test is not yet approved or cleared  by the [place] FDA and has been authorized for detection and/or diagnosis of SARS-CoV-2 by FDA under an Emergency Use Authorization (EUA).  This EUA will remain in effect (meaning this test can be used) for the duration of the COVID-19 declaration under Section 564(b)(1) of the Act, 21 U.S.C. section 360bbb-3(b)(1), unless the authorization is terminated or revoked sooner. Performed at [facility], [address] [place], [place] [address]      Medications:   . atorvastatin  40 mg Oral Daily  . citalopram  20 mg Oral Daily  . dipyridamole-aspirin  1 capsule Oral BID  . heparin   5,000 Units Subcutaneous Q8H  . LORazepam  0.5 mg Intravenous Once  . memantine  28 mg Oral Daily  . metoprolol tartrate  25 mg Oral BID  . pantoprazole  40 mg Oral Daily  . sodium chloride flush  3 mL Intravenous Q12H  . traZODone  50 mg Oral QHS   Continuous Infusions: . cefTRIAXone (ROCEPHIN)  IV 1 g (10/10/19 0315)      LOS: 0 days   Marinda Elk  [facility]  10/10/2019, 10:27 AM

## 2019-10-10 NOTE — Care Management Obs Status (Signed)
MEDICARE OBSERVATION STATUS NOTIFICATION   Patient Details  Name: Amanda Owen MRN: [id number] Date of Birth: Jan 13, 1923   Medicare Observation Status Notification Given:  Yes    Deveron Furlong, RN 10/10/2019, 9:01 AM

## 2019-10-10 NOTE — Progress Notes (Signed)
Patient very lethargic, weak, confused, very little PO intake.  Will defer orthostatic check until PT sees in AM.

## 2019-10-10 NOTE — TOC Initial Note (Addendum)
Transition of Care [facility]) - Initial/Assessment Note    Patient Details  Name: Amanda Owen MRN: [id number] Date of Birth: 1922-09-25  Transition of Care [facility]) CM/SW Contact:    Deveron Furlong, RN 10/10/2019, 9:32 AM  Clinical Narrative:                 Spoke to patient's daughter regarding plans for transition.  Patient's daughter states she would like for patient to go to rehab at a SNF in [place], preferably [facility] or [facility].  Patient is currently in Memory Care at [facility] about 1 month ago.  Explained to daughter that this OBS stay will not qualify patient to go to SNF and last hospitalization may not be within 30 days. Communicated with CSW.  TOC will f/u when PT/OT evals complete.   Expected Discharge Plan: Memory Care      Expected Discharge Plan and Services Expected Discharge Plan: Memory Care       Living arrangements for the past 2 months: Assisted Living Facility                       Prior Living Arrangements/Services Living arrangements for the past 2 months: Assisted Living Facility Lives with:: Facility Resident Patient language and need for interpreter reviewed:: No        Need for Family Participation in Patient Care: Yes (Comment) Care giver support system in place?: Yes (comment)   Criminal Activity/Legal Involvement Pertinent to Current Situation/Hospitalization: No - Comment as needed  Permission Sought/Granted       Permission granted to share info w Relationship: Donnajean Lopes- daughter     Emotional Assessment       Orientation: : Oriented to Self, Oriented to Place, Fluctuating Orientation (Suspected and/or reported Sundowners) Alcohol / Substance Use: Not Applicable Psych Involvement: No (comment)  Admission diagnosis:  Elevated troponin [R77.8] Acute cystitis without hematuria [N30.00] Altered mental status, unspecified altered mental status type [R41.82] Syncope,  unspecified syncope type [R55] Recurrent syncope [R55] AMS (altered mental status) [R41.82] Patient Active Problem List   Diagnosis Date Noted  . Recurrent syncope 10/09/2019  . AKI (acute kidney injury) (HCC) 10/09/2019  . History of completed stroke 10/09/2019  . Dementia (HCC) 10/09/2019  . Elevated troponin 10/09/2019  . Hypokalemia 10/09/2019  . Bacteriuria 10/09/2019  . Hypertension   . Acute cystitis without hematuria   . Syncope   . Chronic cough 07/31/2010   PCP:  Charlesetta Shanks, MD Pharmacy:   [id number] - [place], [place] - [address] [place] [address] Phone: [phone] Fax: [phone]

## 2019-10-11 LAB — CBC
HCT: 41.6 % (ref 36.0–46.0)
Hemoglobin: 13 g/dL (ref 12.0–15.0)
MCH: 29.3 pg (ref 26.0–34.0)
MCHC: 31.3 g/dL (ref 30.0–36.0)
MCV: 93.9 fL (ref 80.0–100.0)
Platelets: 275 10*3/uL (ref 150–400)
RBC: 4.43 MIL/uL (ref 3.87–5.11)
RDW: 16.4 % — ABNORMAL HIGH (ref 11.5–15.5)
WBC: 9.5 10*3/uL (ref 4.0–10.5)
nRBC: 0 % (ref 0.0–0.2)

## 2019-10-11 LAB — BASIC METABOLIC PANEL
Anion gap: 11 (ref 5–15)
BUN: 14 mg/dL (ref 8–23)
CO2: 24 mmol/L (ref 22–32)
Calcium: 8.7 mg/dL — ABNORMAL LOW (ref 8.9–10.3)
Chloride: 104 mmol/L (ref 98–111)
Creatinine, Ser: 1.16 mg/dL — ABNORMAL HIGH (ref 0.44–1.00)
GFR calc Af Amer: 47 mL/min — ABNORMAL LOW (ref >60)
GFR calc non Af Amer: 40 mL/min — ABNORMAL LOW (ref >60)
Glucose, Bld: 93 mg/dL (ref 70–99)
Potassium: 4.2 mmol/L (ref 3.5–5.1)
Sodium: 139 mmol/L (ref 135–145)

## 2019-10-11 LAB — FOLATE RBC
Folate, Hemolysate: 269 ng/mL
Folate, RBC: 717 ng/mL (ref >498)
Hematocrit: 37.5 % (ref 34.0–46.6)

## 2019-10-11 LAB — URINE CULTURE: Culture: >=100000 — AB

## 2019-10-11 LAB — GLUCOSE, CAPILLARY: Glucose-Capillary: 81 mg/dL (ref 70–99)

## 2019-10-11 MED ORDER — AMLODIPINE BESYLATE 5 MG PO TABS: 5.0000 mg | ORAL_TABLET | Freq: Every day | ORAL | 11 refills | Status: AC

## 2019-10-11 MED ORDER — WHITE PETROLATUM EX OINT
TOPICAL_OINTMENT | CUTANEOUS | Status: AC
  Filled 2019-10-11: qty 28.35

## 2019-10-11 NOTE — TOC Initial Note (Signed)
Transition of Care [facility]) - Initial/Assessment Note    Patient Details  Name: Amanda Owen MRN: [id number] Date of Birth: Sep 02, 1922  Transition of Care [facility]) CM/SW Contact:    [name], LCSWA Phone Number: 10/11/2019, 10:14 AM  Clinical Narrative:                  CSW spoke with patients daughter [name]. Plan is for patient to go back to [facility] place with palliative services to follow. CSW reached out to [place] with [facility] who will reach out to patients daughter to set up palliative services to follow patient.  CSW will continue to follow.  Expected Discharge Plan: Assisted Living Barriers to Discharge: No Barriers Identified   Patient Goals and CMS Choice Patient states their goals for this hospitalization and ongoing recovery are:: to go to [facility].gov Compare Post Acute Care list provided to:: Patient Represenative (must comment)([name]) Choice offered to / list presented to : Adult Children([name])  Expected Discharge Plan and Services Expected Discharge Plan: Assisted Living       Living arrangements for the past 2 months: Assisted Living Facility Expected Discharge Date: 10/11/19                                    Prior Living Arrangements/Services Living arrangements for the past 2 months: Assisted Living Facility Lives with:: Self, Facility Resident Patient language and need for interpreter reviewed:: Yes Do you feel safe going back to the place where you live?: Yes      Need for Family Participation in Patient Care: Yes (Comment) Care giver support system in place?: Yes (comment)   Criminal Activity/Legal Involvement Pertinent to Current Situation/Hospitalization: No - Comment as needed  Activities of Daily Living      Permission Sought/Granted Permission sought to share information with : Case Manager, Family Supports, Passenger transport manager granted to share info w  Relationship: Chief Technology Officer- daughter     Emotional Assessment       Orientation: : Oriented to Self Alcohol / Substance Use: Not Applicable Psych Involvement: No (comment)  Admission diagnosis:  Elevated troponin [R77.8] Acute cystitis without hematuria [N30.00] Altered mental status, unspecified altered mental status type [R41.82] Syncope, unspecified syncope type [R55] Recurrent syncope [R55] AMS (altered mental status) [R41.82] Patient Active Problem List   Diagnosis Date Noted  . Recurrent syncope 10/09/2019  . AKI (acute kidney injury) (HCC) 10/09/2019  . History of completed stroke 10/09/2019  . Dementia (HCC) 10/09/2019  . Elevated troponin 10/09/2019  . Hypokalemia 10/09/2019  . Bacteriuria 10/09/2019  . Hypertension   . Acute cystitis without hematuria   . Syncope   . Chronic cough 07/31/2010   PCP:  Charlesetta Shanks, MD Pharmacy:   [id number] - [place], [place] - [address] [place] [address] Phone: [phone] Fax: [phone]     Social Determinants of Health (SDOH) Interventions    Readmission Risk Interventions No flowsheet data found.

## 2019-10-11 NOTE — TOC Transition Note (Signed)
Transition of Care [facility]) - CM/SW Discharge Note   Patient Details  Name: Amanda Owen MRN: [id number] Date of Birth: July 16, 1922  Transition of Care [facility]) CM/SW Contact:  [name], LCSWA Phone Number: 10/11/2019, 11:25 AM   Clinical Narrative:     Patient will DC to: [facility]  Anticipated DC date: 10/11/2019  Family notified: [name]  Transport by: [name]  ?  Per MD patient ready for DC to [facility] with palliative services to follow . RN, patient, patient's [name] With [facility] and facility notified of DC. Discharge Summary sent to facility. RN given number for report [name] tele# [phone] RM#[phone]. DC packet on chart. Ambulance transport requested for patient.  CSW signing off.  Final next level of care: Assisted Living Barriers to Discharge: No Barriers Identified   Patient Goals and CMS Choice Patient states their goals for this hospitalization and ongoing recovery are:: ALF CMS Medicare.gov Compare Post Acute Care list provided to:: Patient Represenative (must comment)([name]) Choice offered to / list presented to : Adult Children  Discharge Placement              Patient chooses bed at: [facility]/ALF) Patient to be transferred to facility by: [facility] Name of family member notified: [name] Patient and family notified of of transfer: 10/11/19  Discharge Plan and Services                                     Social Determinants of Health (SDOH) Interventions     Readmission Risk Interventions No flowsheet data found.

## 2019-10-11 NOTE — Evaluation (Signed)
Physical Therapy Evaluation Patient Details Name: Amanda Owen MRN: [id number] DOB: [birth year] Today's Date: 10/11/2019   History of Present Illness  Amanda Owen is an 86 y.o. female past medical history significant for dementia, essential hypertension.  During current admission workup for syncope.  Clinical Impression  Pt admitted with above diagnosis. Pt was unable to come to eOB due to resisting movement as she c/o pain bil LES.  Could not get pt to EOB.  Noted that plan is for pt to go to A living with palliative care services. Recommend HHPT as well.  Will continue to progress pt as able.  Pt currently with functional limitations due to the deficits listed below (see PT Problem List). Pt will benefit from skilled PT to increase their independence and safety with mobility to allow discharge to the venue listed below.      Follow Up Recommendations Home health PT;Supervision/Assistance - 24 hour    Equipment Recommendations  None recommended by PT    Recommendations for Other Services       Precautions / Restrictions Precautions Precautions: Fall Restrictions Weight Bearing Restrictions: No      Mobility  Bed Mobility Overal bed mobility: Needs Assistance Bed Mobility: Supine to Sit     Supine to sit: Max assist;+2 for physical assistance     General bed mobility comments: Pt was unable to come to EOB even with max assist due to LE pain with pt actually trying to hit at PT.  Could not get her to EOB due to pt resistant.   Transfers                 General transfer comment: Unable   Ambulation/Gait             General Gait Details: unable  Stairs            Wheelchair Mobility    Modified Rankin (Stroke Patients Only)       Balance                                             Pertinent Vitals/Pain Pain Assessment: Faces Faces Pain Scale: Hurts whole lot Pain Location: bil LES Pain Descriptors / Indicators: Aching Pain  Intervention(s): Limited activity within patient's tolerance;Monitored during session;Repositioned    Home Living Family/patient expects to be discharged to:: Assisted living               Home Equipment: (unsure as pt poor historian) Additional Comments: Pt could not answer questions    Prior Function Level of Independence: Needs assistance   Gait / Transfers Assistance Needed: Unsure but pt was in A living PTA   ADL's / Homemaking Assistance Needed: Assume assist with B/D  Comments: Was living in memory care A living PTA     Hand Dominance        Extremity/Trunk Assessment   Upper Extremity Assessment Upper Extremity Assessment: Defer to OT evaluation    Lower Extremity Assessment Lower Extremity Assessment: Generalized weakness    Cervical / Trunk Assessment Cervical / Trunk Assessment: Kyphotic  Communication   Communication: (slurred speech)  Cognition Arousal/Alertness: Awake/alert Behavior During Therapy: Flat affect Overall Cognitive Status: History of cognitive impairments - at baseline Area of Impairment: Safety/judgement;Problem solving;Following commands  Following Commands: Follows one step commands inconsistently;Follows one step commands with increased time Safety/Judgement: Decreased awareness of safety;Decreased awareness of deficits   Problem Solving: Slow processing;Decreased initiation;Difficulty sequencing;Requires verbal cues        General Comments      Exercises General Exercises - Upper Extremity Shoulder Flexion: AROM;Both;5 reps;Supine Elbow Flexion: AROM;Both;10 reps;Supine General Exercises - Lower Extremity Ankle Circles/Pumps: AROM;Both;10 reps;Supine Heel Slides: AROM;Both;10 reps;Supine   Assessment/Plan    PT Assessment Patient needs continued PT services  PT Problem List Decreased activity tolerance;Decreased balance;Decreased mobility;Decreased knowledge of use of DME;Decreased safety  awareness;Decreased knowledge of precautions;Pain       PT Treatment Interventions DME instruction;Gait training;Functional mobility training;Therapeutic activities;Therapeutic exercise;Balance training;Patient/family education    PT Goals (Current goals can be found in the Care Plan section)  Acute Rehab PT Goals Patient Stated Goal: unable to state PT Goal Formulation: Patient unable to participate in goal setting Time For Goal Achievement: 10/25/19 Potential to Achieve Goals: Fair    Frequency Min 2X/week   Barriers to discharge Decreased caregiver support      Co-evaluation               AM-PAC PT "6 Clicks" Mobility  Outcome Measure Help needed turning from your back to your side while in a flat bed without using bedrails?: Total Help needed moving from lying on your back to sitting on the side of a flat bed without using bedrails?: Total Help needed moving to and from a bed to a chair (including a wheelchair)?: Total Help needed standing up from a chair using your arms (e.g., wheelchair or bedside chair)?: Total Help needed to walk in hospital room?: Total Help needed climbing 3-5 steps with a railing? : Total 6 Click Score: 6    End of Session   Activity Tolerance: Patient limited by fatigue(self limiting) Patient left: in bed;with call bell/phone within reach;with bed alarm set;with restraints reapplied Nurse Communication: Mobility status PT Visit Diagnosis: Unsteadiness on feet (R26.81);Pain;Muscle weakness (generalized) (M62.81) Pain - part of body: Leg(bilaterally)    Time: 9326-7124 PT Time Calculation (min) (ACUTE ONLY): 12 min   Charges:   PT Evaluation $PT Eval Low Complexity: 1 Low          Raylynn Hersh W,PT Acute Rehabilitation Services Pager:  [phone]  Office:  [phone]    Berline Lopes 10/11/2019, 10:59 AM

## 2019-10-11 NOTE — NC FL2 (Addendum)
[place] LEVEL OF CARE SCREENING TOOL     IDENTIFICATION  Patient Name: Amanda Owen Birthdate: [birth year] Sex: female Admission Date (Current Location): 10/08/2019  [facility] and [place] Number:  Herbalist and Address:  [facility]/ALF)      Provider Number: [id number]  Attending Physician Name and Address:  Charlynne Cousins, MD  Relative Name and Phone Number:  [name] [phone]    Current Level of Care:   Recommended Level of Care: [place] Prior Approval Number:    Date Approved/Denied:   PASRR Number:    Discharge Plan: Other (Comment)(ALF/[facility])    Current Diagnoses: Patient Active Problem List   Diagnosis Date Noted  . Recurrent syncope 10/09/2019  . AKI (acute kidney injury) ([place]) 10/09/2019  . History of completed stroke 10/09/2019  . Dementia ([place]) 10/09/2019  . Elevated troponin 10/09/2019  . Hypokalemia 10/09/2019  . Bacteriuria 10/09/2019  . Hypertension   . Acute cystitis without hematuria   . Syncope   . Chronic cough 07/31/2010    Orientation RESPIRATION BLADDER Height & Weight     Self  Normal Incontinent, External catheter(External Urinary Catheter) Weight: 145 lb 12.8 oz (66.1 kg) Height:     BEHAVIORAL SYMPTOMS/MOOD NEUROLOGICAL BOWEL NUTRITION STATUS      Continent Diet(See Discharge Summary) Diet - low sodium heart healthy    AMBULATORY STATUS COMMUNICATION OF NEEDS Skin   Total Care Verbally Other (Comment)(Appropriate for ethnicity, Dry, Flaky, Intact)                       Personal Care Assistance Level of Assistance  Bathing, Feeding, Dressing Bathing Assistance: Maximum assistance Feeding assistance: Maximum assistance Dressing Assistance: Maximum assistance     Functional Limitations Info  Sight, Hearing, Speech Sight Info: Impaired Hearing Info: Impaired Speech Info: Impaired    SPECIAL CARE FACTORS FREQUENCY  PT (By licensed PT), OT (By licensed OT)      PT Frequency: 3x min weekly OT Frequency: 3x min weekly            Contractures Contractures Info: Not present    Additional Factors Info  Code Status Code Status Info: DNR               Discharge Medications: Also see discharge summary for a list of discharge medications.  Medication List    TAKE these medications   ALPRAZolam 0.25 MG tablet Commonly known as: XANAX Take 0.25 mg by mouth every 8 (eight) hours as needed for anxiety.   amLODipine 5 MG tablet Commonly known as: NORVASC Take 1 tablet (5 mg total) by mouth daily.   aspirin 81 MG chewable tablet Chew 81 mg by mouth daily.   atorvastatin 40 MG tablet Commonly known as: LIPITOR Take 40 mg by mouth daily.   citalopram 20 MG tablet Commonly known as: CELEXA Take 20 mg by mouth daily.   dipyridamole-aspirin 200-25 MG 12hr capsule Commonly known as: AGGRENOX Take 1 capsule by mouth 2 (two) times daily.   metoprolol tartrate 25 MG tablet Commonly known as: LOPRESSOR Take 25 mg by mouth 2 (two) times daily.   Namenda XR 28 MG Cp24 24 hr capsule Generic drug: memantine Take 28 mg by mouth daily.   omeprazole 20 MG capsule Commonly known as: PRILOSEC Take 20 mg by mouth daily.   traZODone 50 MG tablet Commonly known as: DESYREL Take 25-50 mg by mouth See admin instructions. Take 1/2 tablet twice a day as  needed for agitation and 1 tablet at bedtime.      Relevant Imaging Results:  Relevant Lab Results:   Additional Information SSN-[id number]  [name], LCSWA

## 2019-10-11 NOTE — Progress Notes (Signed)
Patent examiner [facility])  Hospital Liaison: RN note         Notified by [facility] manager of patient/family request for [facility] Palliative services at [facility] after discharge.                     [facility] Palliative team will follow up with patient after discharge.         Please call with any hospice or palliative related questions.         Thank you for this referral.         Elsie Saas, RN, CCM  [facility] Liaison (listed on AMION under Hospice/[facility])    [phone]

## 2019-10-11 NOTE — Discharge Summary (Addendum)
Physician Discharge Summary  Amanda Owen ACZ:[id number] DOB: 06/25/1922 DOA: 10/08/2019  PCP: Charlesetta Shanks, MD  Admit date: 10/08/2019 Discharge date: 10/11/2019  Admitted From: ALF Disposition:  SNF  Recommendations for Outpatient Follow-up:  1. Follow up with PCP in 1-2 weeks 2. Palliative care to meet with family at facility discuss end-of-life as patient has significantly poor oral intake of both solids and liquids.    Home Health:No Equipment/Devices:None  Discharge Condition:Stable CODE STATUSDNR Diet recommendation: Heart Healthy  Brief/Interim Summary: 86 y.o. female past medical history significant for dementia, essential hypertension current admission [facility] for syncope no presents to the ED for evaluation for similar the ED vitals were stable, potassium 3.4 with a creatinine of 1.4 (baseline around 0.8 just a month ago) troponins unremarkable CBC unremarkable.  Discharge Diagnoses:  Principal Problem:   Recurrent syncope Active Problems:   AKI (acute kidney injury) (HCC)   History of completed stroke   Dementia (HCC)   Elevated troponin   Hypokalemia   Bacteriuria   Hypertension  Recurrent syncope: She was monitored on telemetry with no events EKG showed no signs of ischemia just a prolonged QTC of 500, orthostatics were positive physical therapy evaluation evaluated the patient recommended skilled nursing facility. MRI of the brain was a poor exam but it did not show any strokes. EEG showed no signs of seizures. Her B12 was 400 TSH 1.7. Her creatinine was elevated on admission with poor oral intake in house to both solids and liquids which points towards significant decrease in oral intake in the setting of advanced dementia. Xanax was discontinued. Hospice and palliative care will have to meet with family at facility to discuss end-of-life, as they have unrealistic expectations.  Acute kidney injury: Likely prerenal azotemia resolved with IV  fluids This is like due to the patient's significant decreased oral intake.  Asymptomatic bacteriuria: Urine culture is negative antibiotics stopped.  Prolonged QTC: It improved after repletion of her potassium and magnesium correcting potassium magnesium greater than 2.  History of CVA: Continue Aggrenox and Lipitor as an outpatient.  Advance dementia with behavioral disturbances: Continue Namenda Celexa and trazodone. Xanax was discontinued and she is at high risk of falls.  Essential hypertension: She will only go home on metoprolol.  Elevated troponins: Troponins remain flat she denied any chest pain or shortness of breath unlikely ACS  Discharge Instructions  Discharge Instructions    Diet - low sodium heart healthy   Complete by: As directed    Increase activity slowly   Complete by: As directed      Allergies as of 10/11/2019   No Known Allergies     Medication List    TAKE these medications   ALPRAZolam 0.25 MG tablet Commonly known as: XANAX Take 0.25 mg by mouth every 8 (eight) hours as needed for anxiety.   amLODipine 5 MG tablet Commonly known as: NORVASC Take 1 tablet (5 mg total) by mouth daily.   aspirin 81 MG chewable tablet Chew 81 mg by mouth daily.   atorvastatin 40 MG tablet Commonly known as: LIPITOR Take 40 mg by mouth daily.   citalopram 20 MG tablet Commonly known as: CELEXA Take 20 mg by mouth daily.   dipyridamole-aspirin 200-25 MG 12hr capsule Commonly known as: AGGRENOX Take 1 capsule by mouth 2 (two) times daily.   metoprolol tartrate 25 MG tablet Commonly known as: LOPRESSOR Take 25 mg by mouth 2 (two) times daily.   Namenda XR 28 MG Cp24 24 hr capsule  Generic drug: memantine Take 28 mg by mouth daily.   omeprazole 20 MG capsule Commonly known as: PRILOSEC Take 20 mg by mouth daily.   traZODone 50 MG tablet Commonly known as: DESYREL Take 25-50 mg by mouth See admin instructions. Take 1/2 tablet twice a day as  needed for agitation and 1 tablet at bedtime.       No Known Allergies  Consultations:  none   Procedures/Studies: DG Chest 1 View  Result Date: 10/09/2019 CLINICAL DATA:  Syncope EXAM: CHEST  1 VIEW COMPARISON:  08/29/2019 FINDINGS: Lungs are clear.  No pleural effusion or pneumothorax. The heart is normal in size. IMPRESSION: No evidence of acute cardiopulmonary disease. Electronically Signed   By: Charline Bills M.D.   On: 10/09/2019 02:15   CT Head Wo Contrast  Result Date: 10/09/2019 CLINICAL DATA:  Encephalopathy. EXAM: CT HEAD WITHOUT CONTRAST TECHNIQUE: Contiguous axial images were obtained from the base of the skull through the vertex without intravenous contrast. COMPARISON:  Head CT 08/29/2019 at [facility] FINDINGS: Brain: No intracranial hemorrhage, mass effect, or midline shift. Stable degree of atrophy and chronic small vessel ischemia. No hydrocephalus. The basilar cisterns are patent. Bilateral basal gangliar mineralization. No evidence of territorial infarct or acute ischemia. Chronic linear calcification in the right frontal lobe. No extra-axial or intracranial fluid collection. Vascular: Atherosclerosis of skullbase vasculature without hyperdense vessel or abnormal calcification. Skull: No fracture or focal lesion. Sinuses/Orbits: Dysconjugate gaze, typically incidental. Left cataract resection. Paranasal sinuses and mastoid air cells are clear. The visualized orbits are unremarkable. Other: None. IMPRESSION: 1. No acute intracranial abnormality. 2. Stable atrophy and chronic small vessel ischemia. Electronically Signed   By: Narda Rutherford M.D.   On: 10/09/2019 01:55   MR BRAIN WO CONTRAST  Result Date: 10/09/2019 CLINICAL DATA:  Encephalopathy. Additional history provided: Dementia, altered mental status, recent fall. EXAM: MRI HEAD WITHOUT CONTRAST TECHNIQUE: Multiplanar, multiecho pulse sequences of the brain and surrounding structures were obtained without  intravenous contrast. COMPARISON:  Noncontrast head CT 10/09/2019 FINDINGS: Brain: The patient was unable to tolerate the examination. As a result, only axial and coronal diffusion-weighted imaging, a sagittal T1 weighted sequence, axial T2 weighted sequence and a coronal T2 weighted sequence could be obtained. The axial diffusion-weighted sequence is of good quality. There is moderate motion degradation of the coronal diffusion-weighted sequence. The remainder of the examination is severely motion degraded and nondiagnostic. There is a punctate focus of apparent restricted diffusion within the paramedian posterior left frontal lobe which may reflect a tiny acute infarct or artifact (series 3, image 40). Vascular: Poorly assessed due to motion degradation. Skull and upper cervical spine: Poorly assessed due to motion degradation. Sinuses/Orbits: Poorly assessed due to motion degradation. IMPRESSION: Significantly motion degraded, limited and prematurely terminated examination as described. With the exception of the diffusion-weighted imaging, the examination is nondiagnostic. Punctate focus of apparent restricted diffusion within the paramedian posterior left frontal lobe, which may reflect a tiny acute infarct or artifact. Electronically Signed   By: Jackey Loge DO   On: 10/09/2019 16:21   EEG adult  Result Date: 10/09/2019 Charlsie Quest, MD     10/09/2019  4:23 PM Patient Name: Amanda Owen MRN: [id number] Epilepsy Attending: Charlsie Quest Referring Physician/Provider: Dr Marinda Elk Date: 10/09/2019 Duration: 26.06 mins Patient history: 86yo F with recurrent syncope. EEG to evaluate for seizure. Level of alertness: Awake AEDs during EEG study: None Technical aspects: This EEG study was done with scalp electrodes positioned according  to the 10-20 International system of electrode placement. Electrical activity was acquired at a sampling rate of 500Hz  and reviewed with a high frequency filter of 70Hz   and a low frequency filter of 1Hz . EEG data were recorded continuously and digitally stored. Description: The posterior dominant rhythm consists of 9 Hz activity of moderate voltage (25-35 uV) seen predominantly in posterior head regions, symmetric and reactive to eye opening and eye closing. Hyperventilation and photic stimulation were not performed.   IMPRESSION: This study is within normal limits. No seizures or epileptiform discharges were seen throughout the recording. Priyanka O Yadav    (Echo, Carotid, EGD, Colonoscopy, ERCP)    Subjective: Nonverbal this morning.  Discharge Exam: Vitals:   10/11/19 0647 10/11/19 0740  BP: (!) 168/81 (!) 178/95  Pulse: 77 75  Resp: 14 15  Temp: 98.5 F (36.9 C) 97.8 F (36.6 C)  SpO2: 95% 93%   Vitals:   10/11/19 0029 10/11/19 0455 10/11/19 0647 10/11/19 0740  BP: (!) 168/87 (!) 175/71 (!) 168/81 (!) 178/95  Pulse: 68 66 77 75  Resp: 18 15 14 15   Temp: 98 F (36.7 C) 98.4 F (36.9 C) 98.5 F (36.9 C) 97.8 F (36.6 C)  TempSrc: Axillary Axillary Axillary Axillary  SpO2: 94% 93% 95% 93%  Weight:   66.1 kg     General: Pt is alert, awake, not in acute distress Cardiovascular: RRR, S1/S2 +, no rubs, no gallops Respiratory: CTA bilaterally, no wheezing, no rhonchi Abdominal: Soft, NT, ND, bowel sounds + Extremities: no edema, no cyanosis    The results of significant diagnostics from this hospitalization (including imaging, microbiology, ancillary and laboratory) are listed below for reference.     Microbiology: Recent Results (from the past 240 hour(s))  Urine culture     Status: Abnormal   Collection Time: 10/09/19 12:13 AM   Specimen: Urine, Catheterized  Result Value Ref Range Status   Specimen Description URINE, CATHETERIZED  Final   Special Requests   Final    NONE Performed at [facility], [address] [place], [place] [id number]    Culture >=100,000 COLONIES/mL ESCHERICHIA COLI (A)  Final   Report Status  10/11/2019 FINAL  Final   Organism ID, Bacteria ESCHERICHIA COLI (A)  Final      Susceptibility   Escherichia coli - MIC*    AMPICILLIN <=2 SENSITIVE Sensitive     CEFAZOLIN <=4 SENSITIVE Sensitive     CEFTRIAXONE <=1 SENSITIVE Sensitive     CIPROFLOXACIN <=0.25 SENSITIVE Sensitive     GENTAMICIN <=1 SENSITIVE Sensitive     IMIPENEM <=0.25 SENSITIVE Sensitive     NITROFURANTOIN <=16 SENSITIVE Sensitive     TRIMETH/SULFA <=20 SENSITIVE Sensitive     AMPICILLIN/SULBACTAM <=2 SENSITIVE Sensitive     PIP/TAZO <=4 SENSITIVE Sensitive     * >=100,000 COLONIES/mL ESCHERICHIA COLI  SARS Coronavirus 2 by RT PCR (hospital order, performed in [facility] hospital lab) Nasopharyngeal Nasopharyngeal Swab     Status: None   Collection Time: 10/09/19  7:49 AM   Specimen: Nasopharyngeal Swab  Result Value Ref Range Status   SARS Coronavirus 2 NEGATIVE NEGATIVE Final    Comment: (NOTE) SARS-CoV-2 target nucleic acids are NOT DETECTED. The SARS-CoV-2 RNA is generally detectable in upper and lower respiratory specimens during the acute phase of infection. The lowest concentration of SARS-CoV-2 viral copies this assay can detect is 250 copies / mL. A negative result does not preclude SARS-CoV-2 infection and should not be used as  the sole basis for treatment or other patient management decisions.  A negative result may occur with improper specimen collection / handling, submission of specimen other than nasopharyngeal swab, presence of viral mutation(s) within the areas targeted by this assay, and inadequate number of viral copies (<250 copies / mL). A negative result must be combined with clinical observations, patient history, and epidemiological information. Fact Sheet for Patients:   StrictlyIdeas.no Fact Sheet for Healthcare Providers: BankingDealers.co.za This test is not yet approved or cleared  by the [place] FDA and has been authorized  for detection and/or diagnosis of SARS-CoV-2 by FDA under an Emergency Use Authorization (EUA).  This EUA will remain in effect (meaning this test can be used) for the duration of the COVID-19 declaration under Section 564(b)(1) of the Act, 21 U.S.C. section 360bbb-3(b)(1), unless the authorization is terminated or revoked sooner. Performed at [facility], [place] [address] [place], [place] [address]      Labs: BNP (last 3 results) No results for input(s): BNP in the last 8760 hours. Basic Metabolic Panel: Recent Labs  Lab 10/09/19 0446 10/10/19 0255 10/11/19 0354  NA 138 143 139  K 3.4* 3.7 4.2  CL 104 108 104  CO2 23 24 24   GLUCOSE 98 94 93  BUN 26* 18 14  CREATININE 1.41* 1.17* 1.16*  CALCIUM 9.0 8.7* 8.7*   Liver Function Tests: Recent Labs  Lab 10/09/19 0446  AST 23  ALT 16  ALKPHOS 81  BILITOT 0.6  PROT 6.2*  ALBUMIN 2.9*   No results for input(s): LIPASE, AMYLASE in the last 168 hours. No results for input(s): AMMONIA in the last 168 hours. CBC: Recent Labs  Lab 10/09/19 0434 10/10/19 0255 10/11/19 0354  WBC 8.3 8.3 9.5  NEUTROABS 5.9  --   --   HGB 13.3 11.8* 13.0  HCT 42.9 37.4 41.6  MCV 96.0 93.5 93.9  PLT 244 254 275   Cardiac Enzymes: No results for input(s): CKTOTAL, CKMB, CKMBINDEX, TROPONINI in the last 168 hours. BNP: Invalid input(s): POCBNP CBG: Recent Labs  Lab 10/10/19 0801 10/10/19 1113 10/10/19 1615 10/10/19 2149 10/11/19 0739  GLUCAP 90 76 82 76 81   D-Dimer No results for input(s): DDIMER in the last 72 hours. Hgb A1c No results for input(s): HGBA1C in the last 72 hours. Lipid Profile No results for input(s): CHOL, HDL, LDLCALC, TRIG, CHOLHDL, LDLDIRECT in the last 72 hours. Thyroid function studies Recent Labs    10/09/19 0950  TSH 1.758   Anemia work up Recent Labs    10/09/19 0843  VITAMINB12 426   Urinalysis    Component Value Date/Time   COLORURINE YELLOW 10/09/2019 0013   APPEARANCEUR TURBID  (A) 10/09/2019 0013   LABSPEC 1.019 10/09/2019 0013   PHURINE 5.0 10/09/2019 0013   GLUCOSEU NEGATIVE 10/09/2019 0013   HGBUR MODERATE (A) 10/09/2019 0013   BILIRUBINUR NEGATIVE 10/09/2019 0013   KETONESUR 5 (A) 10/09/2019 0013   PROTEINUR 100 (A) 10/09/2019 0013   UROBILINOGEN 1.0 09/19/2010 1709   NITRITE NEGATIVE 10/09/2019 0013   LEUKOCYTESUR SMALL (A) 10/09/2019 0013   Sepsis Labs Invalid input(s): PROCALCITONIN,  WBC,  LACTICIDVEN Microbiology Recent Results (from the past 240 hour(s))  Urine culture     Status: Abnormal   Collection Time: 10/09/19 12:13 AM   Specimen: Urine, Catheterized  Result Value Ref Range Status   Specimen Description URINE, CATHETERIZED  Final   Special Requests   Final    NONE Performed at [facility], [address], [place], [place] [address]    Culture >=100,000 COLONIES/mL ESCHERICHIA COLI (A)  Final   Report Status 10/11/2019 FINAL  Final   Organism ID, Bacteria ESCHERICHIA COLI (A)  Final      Susceptibility   Escherichia coli - MIC*    AMPICILLIN <=2 SENSITIVE Sensitive     CEFAZOLIN <=4 SENSITIVE Sensitive     CEFTRIAXONE <=1 SENSITIVE Sensitive     CIPROFLOXACIN <=0.25 SENSITIVE Sensitive     GENTAMICIN <=1 SENSITIVE Sensitive     IMIPENEM <=0.25 SENSITIVE Sensitive     NITROFURANTOIN <=16 SENSITIVE Sensitive     TRIMETH/SULFA <=20 SENSITIVE Sensitive     AMPICILLIN/SULBACTAM <=2 SENSITIVE Sensitive     PIP/TAZO <=4 SENSITIVE Sensitive     * >=100,000 COLONIES/mL ESCHERICHIA COLI  SARS Coronavirus 2 by RT PCR (hospital order, performed in [facility] hospital lab) Nasopharyngeal Nasopharyngeal Swab     Status: None   Collection Time: 10/09/19  7:49 AM   Specimen: Nasopharyngeal Swab  Result Value Ref Range Status   SARS Coronavirus 2 NEGATIVE NEGATIVE Final    Comment: (NOTE) SARS-CoV-2 target nucleic acids are NOT DETECTED. The SARS-CoV-2 RNA is generally detectable in upper and lower respiratory specimens during the  acute phase of infection. The lowest concentration of SARS-CoV-2 viral copies this assay can detect is 250 copies / mL. A negative result does not preclude SARS-CoV-2 infection and should not be used as the sole basis for treatment or other patient management decisions.  A negative result may occur with improper specimen collection / handling, submission of specimen other than nasopharyngeal swab, presence of viral mutation(s) within the areas targeted by this assay, and inadequate number of viral copies (<250 copies / mL). A negative result must be combined with clinical observations, patient history, and epidemiological information. Fact Sheet for Patients:   BoilerBrush.com.cy Fact Sheet for Healthcare Providers: https://pope.com/ This test is not yet approved or cleared  by the [place] FDA and has been authorized for detection and/or diagnosis of SARS-CoV-2 by FDA under an Emergency Use Authorization (EUA).  This EUA will remain in effect (meaning this test can be used) for the duration of the COVID-19 declaration under Section 564(b)(1) of the Act, 21 U.S.C. section 360bbb-3(b)(1), unless the authorization is terminated or revoked sooner. Performed at [facility], [address] [place], [place] [address]      Time coordinating discharge: Over 30 minutes  SIGNED:   Marinda Elk, MD  [facility] 10/11/2019, 9:22 AM Pager   If 7PM-7AM, please contact night-coverage [email or web address] Password [email or web address]

## 2019-10-13 ENCOUNTER — Encounter (HOSPITAL_COMMUNITY): Payer: Self-pay | Admitting: Student

## 2019-10-13 ENCOUNTER — Other Ambulatory Visit: Payer: Self-pay

## 2019-10-13 ENCOUNTER — Inpatient Hospital Stay (HOSPITAL_COMMUNITY)
Admission: EM | Admit: 2019-10-13 | Discharge: 2019-10-15 | DRG: 074 | Disposition: A | Discharge status: Home / Self Care | Payer: Medicare Other | Source: Skilled Nursing Facility | Attending: Internal Medicine | Admitting: Internal Medicine

## 2019-10-13 DIAGNOSIS — Z66 Do not resuscitate: Secondary | ICD-10-CM | POA: Diagnosis present

## 2019-10-13 DIAGNOSIS — Z20822 Contact with and (suspected) exposure to covid-19: Secondary | ICD-10-CM | POA: Diagnosis present

## 2019-10-13 DIAGNOSIS — Z7189 Other specified counseling: Secondary | ICD-10-CM

## 2019-10-13 DIAGNOSIS — G908 Other disorders of autonomic nervous system: Principal | ICD-10-CM | POA: Diagnosis present

## 2019-10-13 DIAGNOSIS — I10 Essential (primary) hypertension: Secondary | ICD-10-CM | POA: Diagnosis present

## 2019-10-13 DIAGNOSIS — E785 Hyperlipidemia, unspecified: Secondary | ICD-10-CM | POA: Diagnosis present

## 2019-10-13 DIAGNOSIS — F0390 Unspecified dementia without behavioral disturbance: Secondary | ICD-10-CM | POA: Diagnosis present

## 2019-10-13 DIAGNOSIS — Z8673 Personal history of transient ischemic attack (TIA), and cerebral infarction without residual deficits: Secondary | ICD-10-CM

## 2019-10-13 DIAGNOSIS — R9431 Abnormal electrocardiogram [ECG] [EKG]: Secondary | ICD-10-CM | POA: Diagnosis present

## 2019-10-13 DIAGNOSIS — R55 Syncope and collapse: Secondary | ICD-10-CM | POA: Diagnosis present

## 2019-10-13 DIAGNOSIS — Z8249 Family history of ischemic heart disease and other diseases of the circulatory system: Secondary | ICD-10-CM

## 2019-10-13 DIAGNOSIS — F0391 Unspecified dementia with behavioral disturbance: Secondary | ICD-10-CM | POA: Diagnosis present

## 2019-10-13 DIAGNOSIS — I951 Orthostatic hypotension: Secondary | ICD-10-CM | POA: Diagnosis present

## 2019-10-13 DIAGNOSIS — E876 Hypokalemia: Secondary | ICD-10-CM | POA: Diagnosis present

## 2019-10-13 LAB — URINALYSIS, ROUTINE W REFLEX MICROSCOPIC
Bacteria, UA: NONE SEEN
Bilirubin Urine: NEGATIVE
Glucose, UA: NEGATIVE mg/dL
Ketones, ur: 5 mg/dL — AB
Nitrite: NEGATIVE
Protein, ur: NEGATIVE mg/dL
Specific Gravity, Urine: 1.017 (ref 1.005–1.030)
WBC, UA: >50 WBC/hpf — ABNORMAL HIGH (ref 0–5)
pH: 6 (ref 5.0–8.0)

## 2019-10-13 LAB — MAGNESIUM: Magnesium: 1.5 mg/dL — ABNORMAL LOW (ref 1.7–2.4)

## 2019-10-13 LAB — CBC WITH DIFFERENTIAL/PLATELET
Abs Immature Granulocytes: 0.04 10*3/uL (ref 0.00–0.07)
Basophils Absolute: 0 10*3/uL (ref 0.0–0.1)
Basophils Relative: 0 %
Eosinophils Absolute: 0.1 10*3/uL (ref 0.0–0.5)
Eosinophils Relative: 1 %
HCT: 41 % (ref 36.0–46.0)
Hemoglobin: 12.5 g/dL (ref 12.0–15.0)
Immature Granulocytes: 1 %
Lymphocytes Relative: 15 %
Lymphs Abs: 1.1 10*3/uL (ref 0.7–4.0)
MCH: 29.6 pg (ref 26.0–34.0)
MCHC: 30.5 g/dL (ref 30.0–36.0)
MCV: 97.2 fL (ref 80.0–100.0)
Monocytes Absolute: 0.5 10*3/uL (ref 0.1–1.0)
Monocytes Relative: 7 %
Neutro Abs: 5.6 10*3/uL (ref 1.7–7.7)
Neutrophils Relative %: 76 %
Platelets: 229 10*3/uL (ref 150–400)
RBC: 4.22 MIL/uL (ref 3.87–5.11)
RDW: 16.8 % — ABNORMAL HIGH (ref 11.5–15.5)
WBC: 7.3 10*3/uL (ref 4.0–10.5)
nRBC: 0 % (ref 0.0–0.2)

## 2019-10-13 LAB — COMPREHENSIVE METABOLIC PANEL
ALT: 14 U/L (ref 0–44)
AST: 24 U/L (ref 15–41)
Albumin: 2 g/dL — ABNORMAL LOW (ref 3.5–5.0)
Alkaline Phosphatase: 52 U/L (ref 38–126)
Anion gap: 8 (ref 5–15)
BUN: 20 mg/dL (ref 8–23)
CO2: 17 mmol/L — ABNORMAL LOW (ref 22–32)
Calcium: 5.8 mg/dL — CL (ref 8.9–10.3)
Chloride: 119 mmol/L — ABNORMAL HIGH (ref 98–111)
Creatinine, Ser: 0.67 mg/dL (ref 0.44–1.00)
GFR calc Af Amer: >60 mL/min (ref >60)
GFR calc non Af Amer: >60 mL/min (ref >60)
Glucose, Bld: 75 mg/dL (ref 70–99)
Potassium: 2.9 mmol/L — ABNORMAL LOW (ref 3.5–5.1)
Sodium: 144 mmol/L (ref 135–145)
Total Bilirubin: 0.7 mg/dL (ref 0.3–1.2)
Total Protein: 4.5 g/dL — ABNORMAL LOW (ref 6.5–8.1)

## 2019-10-13 LAB — SARS CORONAVIRUS 2 BY RT PCR (HOSPITAL ORDER, PERFORMED IN ~~LOC~~ HOSPITAL LAB): SARS Coronavirus 2: NEGATIVE

## 2019-10-13 LAB — CALCIUM: Calcium: 8.7 mg/dL — ABNORMAL LOW (ref 8.9–10.3)

## 2019-10-13 MED ORDER — POTASSIUM CHLORIDE CRYS ER 20 MEQ PO TBCR
40.0000 meq | EXTENDED_RELEASE_TABLET | Freq: Once | ORAL | Status: AC
  Administered 2019-10-14: 40 meq via ORAL
  Filled 2019-10-13: qty 2

## 2019-10-13 MED ORDER — POTASSIUM CHLORIDE 10 MEQ/100ML IV SOLN
10.0000 meq | INTRAVENOUS | Status: AC
  Administered 2019-10-13 (×2): 10 meq via INTRAVENOUS
  Filled 2019-10-13 (×2): qty 100

## 2019-10-13 MED ORDER — ASPIRIN-DIPYRIDAMOLE ER 25-200 MG PO CP12
1.0000 | ORAL_CAPSULE | Freq: Two times a day (BID) | ORAL | Status: DC
  Administered 2019-10-13 – 2019-10-15 (×4): 1 via ORAL
  Filled 2019-10-13 (×4): qty 1

## 2019-10-13 MED ORDER — PANTOPRAZOLE SODIUM 40 MG PO TBEC
40.0000 mg | DELAYED_RELEASE_TABLET | Freq: Every day | ORAL | Status: DC
  Administered 2019-10-14 – 2019-10-15 (×2): 40 mg via ORAL
  Filled 2019-10-13 (×2): qty 1

## 2019-10-13 MED ORDER — POTASSIUM CHLORIDE IN NACL 40-0.9 MEQ/L-% IV SOLN
INTRAVENOUS | Status: AC
  Filled 2019-10-13: qty 1000

## 2019-10-13 MED ORDER — SODIUM CHLORIDE 0.9% FLUSH
3.0000 mL | Freq: Two times a day (BID) | INTRAVENOUS | Status: DC
  Administered 2019-10-13 – 2019-10-15 (×2): 3 mL via INTRAVENOUS

## 2019-10-13 MED ORDER — ACETAMINOPHEN 650 MG RE SUPP: 650.0000 mg | Freq: Four times a day (QID) | RECTAL | Status: DC | PRN

## 2019-10-13 MED ORDER — ATORVASTATIN CALCIUM 40 MG PO TABS
40.0000 mg | ORAL_TABLET | Freq: Every day | ORAL | Status: DC
  Administered 2019-10-13 – 2019-10-15 (×3): 40 mg via ORAL
  Filled 2019-10-13 (×3): qty 1

## 2019-10-13 MED ORDER — ENOXAPARIN SODIUM 40 MG/0.4ML ~~LOC~~ SOLN
40.0000 mg | SUBCUTANEOUS | Status: DC
  Administered 2019-10-13 – 2019-10-14 (×2): 40 mg via SUBCUTANEOUS
  Filled 2019-10-13 (×2): qty 0.4

## 2019-10-13 MED ORDER — CALCIUM GLUCONATE-NACL 1-0.675 GM/50ML-% IV SOLN
1.0000 g | Freq: Once | INTRAVENOUS | Status: AC
  Administered 2019-10-13: 1000 mg via INTRAVENOUS
  Filled 2019-10-13: qty 50

## 2019-10-13 MED ORDER — POTASSIUM CHLORIDE CRYS ER 20 MEQ PO TBCR
40.0000 meq | EXTENDED_RELEASE_TABLET | Freq: Two times a day (BID) | ORAL | Status: DC
  Administered 2019-10-13: 40 meq via ORAL
  Filled 2019-10-13: qty 2

## 2019-10-13 MED ORDER — MAGNESIUM SULFATE 2 GM/50ML IV SOLN
2.0000 g | Freq: Once | INTRAVENOUS | Status: AC
  Administered 2019-10-13: 2 g via INTRAVENOUS
  Filled 2019-10-13: qty 50

## 2019-10-13 MED ORDER — ACETAMINOPHEN 325 MG PO TABS: 650.0000 mg | ORAL_TABLET | Freq: Four times a day (QID) | ORAL | Status: DC | PRN

## 2019-10-13 MED ORDER — SODIUM CHLORIDE 0.9 % IV BOLUS
1000.0000 mL | Freq: Once | INTRAVENOUS | Status: AC
  Administered 2019-10-13: 1000 mL via INTRAVENOUS

## 2019-10-13 NOTE — ED Provider Notes (Signed)
[facility] DEPT Provider Note   CSN: [id number] Arrival date & time: 10/13/19  1508     History Chief Complaint  Patient presents with  . Loss of Consciousness    Amanda Owen is a 86 y.o. female.  HPI    86 year old female comes in a chief complaint of loss of consciousness. Patient has history of CKD, hypertension, hyperlipidemia.  Patient was just discharged from the hospital for syncope.  She had a witnessed episode of syncope at the nursing facility.  While in the hospital last time she was noted to have QT prolongation.  Patient has some dementia, level 5 caveat for her dementia. She is aware that she is in the hospital but not sure why she is in the hospital.  She has no chest pain, shortness of breath, palpitations.   Past Medical History:  Diagnosis Date  . Chronic kidney failure   . Depression   . Hyperlipidemia   . Hypertension   . Memory deficit   . Occlusion of carotid artery   . Vitamin deficiency     Patient Active Problem List   Diagnosis Date Noted  . Hypomagnesemia 10/13/2019  . Hypocalcemia 10/13/2019  . Recurrent syncope 10/09/2019  . AKI (acute kidney injury) ([place]) 10/09/2019  . History of completed stroke 10/09/2019  . Dementia ([place]) 10/09/2019  . Elevated troponin 10/09/2019  . Hypokalemia 10/09/2019  . Bacteriuria 10/09/2019  . Hypertension   . Acute cystitis without hematuria   . Syncope   . Chronic cough 07/31/2010    Past Surgical History:  Procedure Laterality Date  . ABDOMINAL HYSTERECTOMY       OB History   No obstetric history on file.     Family History  Problem Relation Age of Onset  . Heart disease Mother   . Heart disease Sister   . Heart disease Brother     Social History   Tobacco Use  . Smoking status: Never Smoker  . Smokeless tobacco: Never Used  Vaping Use  . Vaping Use: Never assessed  Substance Use Topics  . Alcohol use: No  . Drug use: Not on file    Home  Medications Prior to Admission medications   Medication Sig Start Date End Date Taking? Authorizing Provider  ALPRAZolam (XANAX) 0.25 MG tablet Take 0.25 mg by mouth every 8 (eight) hours as needed for anxiety.   Yes [provider]  atorvastatin (LIPITOR) 40 MG tablet Take 40 mg by mouth daily. 08/27/19  Yes [provider]  citalopram (CELEXA) 20 MG tablet Take 20 mg by mouth daily.     Yes [provider]  dipyridamole-aspirin (AGGRENOX) 200-25 MG per 12 hr capsule Take 1 capsule by mouth 2 (two) times daily.   Yes [provider]  memantine (NAMENDA XR) 28 MG CP24 24 hr capsule Take 28 mg by mouth daily.   Yes [provider]  metoprolol tartrate (LOPRESSOR) 25 MG tablet Take 25 mg by mouth 2 (two) times daily.   Yes [provider]  omeprazole (PRILOSEC) 20 MG capsule Take 20 mg by mouth daily.   Yes [provider]  traZODone (DESYREL) 50 MG tablet Take 25 mg by mouth 2 (two) times daily as needed for sleep.    Yes [provider]  amLODipine (NORVASC) 5 MG tablet Take 1 tablet (5 mg total) by mouth daily. Patient not taking: Reported on 10/13/2019 10/11/19 10/10/20  Charlynne Cousins, MD  phosphorus (K PHOS NEUTRAL) [phone] MG tablet  Take 2 tablets (500 mg total) by mouth 3 (three) times daily. 10/15/19   Burnadette Pop, MD    Allergies    Ace inhibitors  Review of Systems   Review of Systems  Unable to perform ROS: Dementia     Physical Exam Updated Vital Signs BP (!) 173/77 (BP Location: Left Arm)   Pulse 87   Temp 98.3 F (36.8 C) (Oral)   Resp 18   Ht 5\' 4"  (1.626 m)   Wt 66 kg   SpO2 98%   BMI 24.98 kg/m   Physical Exam Vitals and nursing note reviewed.  Constitutional:      Appearance: She is well-developed.  HENT:     Head: Normocephalic and atraumatic.  Cardiovascular:     Rate and Rhythm: Normal rate.  Pulmonary:     Effort: Pulmonary effort is normal.  Abdominal:     General: Bowel  sounds are normal.  Musculoskeletal:     Cervical back: Normal range of motion and neck supple.  Skin:    General: Skin is warm and dry.  Neurological:     Mental Status: She is alert and oriented to person, place, and time.     ED Results / Procedures / Treatments   Labs (all labs ordered are listed, but only abnormal results are displayed) Labs Reviewed  COMPREHENSIVE METABOLIC PANEL - Abnormal; Notable for the following components:      Result Value   Potassium 2.9 (*)    Chloride 119 (*)    CO2 17 (*)    Calcium 5.8 (*)    Total Protein 4.5 (*)    Albumin 2.0 (*)    All other components within normal limits  CBC WITH DIFFERENTIAL/PLATELET - Abnormal; Notable for the following components:   RDW 16.8 (*)    All other components within normal limits  URINALYSIS, ROUTINE W REFLEX MICROSCOPIC - Abnormal; Notable for the following components:   APPearance HAZY (*)    Hgb urine dipstick SMALL (*)    Ketones, ur 5 (*)    Leukocytes,Ua LARGE (*)    WBC, UA >50 (*)    All other components within normal limits  MAGNESIUM - Abnormal; Notable for the following components:   Magnesium 1.5 (*)    All other components within normal limits  CALCIUM - Abnormal; Notable for the following components:   Calcium 8.7 (*)    All other components within normal limits  MAGNESIUM - Abnormal; Notable for the following components:   Magnesium 2.5 (*)    All other components within normal limits  PHOSPHORUS - Abnormal; Notable for the following components:   Phosphorus 1.6 (*)    All other components within normal limits  BASIC METABOLIC PANEL - Abnormal; Notable for the following components:   Calcium 8.4 (*)    All other components within normal limits  CBC - Abnormal; Notable for the following components:   RBC 3.71 (*)    Hemoglobin 11.1 (*)    HCT 34.9 (*)    RDW 16.7 (*)    All other components within normal limits  PTH, INTACT AND CALCIUM - Abnormal; Notable for the following  components:   Calcium, Total (PTH) 8.1 (*)    All other components within normal limits  PHOSPHORUS - Abnormal; Notable for the following components:   Phosphorus 2.0 (*)    All other components within normal limits  SARS CORONAVIRUS 2 BY RT PCR (HOSPITAL ORDER, PERFORMED IN [facility])  MRSA PCR  SCREENING    EKG EKG Interpretation  Date/Time:  Wednesday October 13 2019 15:24:11 EDT Ventricular Rate:  79 PR Interval:    QRS Duration: 92 QT Interval:  476 QTC Calculation: 546 R Axis:   -13 Text Interpretation: Sinus rhythm Multiple premature complexes, vent & supraven LVH with secondary repolarization abnormality Prolonged QT interval No acute changes Confirmed by Derwood Kaplan [phone]) on 10/13/2019 4:15:00 PM   Radiology No results found.  Procedures .Critical Care Performed by: Derwood Kaplan, MD Authorized by: Derwood Kaplan, MD   Critical care provider statement:    Critical care time (minutes):  32   Critical care was necessary to treat or prevent imminent or life-threatening deterioration of the following conditions:  Cardiac failure and circulatory failure   Critical care was time spent personally by me on the following activities:  Discussions with consultants, evaluation of patient's response to treatment, examination of patient, ordering and performing treatments and interventions, ordering and review of laboratory studies, ordering and review of radiographic studies, pulse oximetry, re-evaluation of patient's condition, obtaining history from patient or surrogate and review of old charts   (including critical care time)  Medications Ordered in ED Medications  potassium chloride 10 mEq in 100 mL IVPB (10 mEq Intravenous New Bag/Given 10/14/19 0153)  0.9 % NaCl with KCl 40 mEq / L  infusion (0 mL/hr Intravenous Stopped 10/14/19 0959)  sodium chloride 0.9 % bolus 1,000 mL (0 mLs Intravenous Stopped 10/13/19 1823)  magnesium sulfate IVPB 2 g 50 mL (0 g  Intravenous Stopped 10/13/19 1916)  calcium gluconate 1 g/ 50 mL sodium chloride IVPB (0 g Intravenous Stopped 10/13/19 1916)  potassium chloride SA (KLOR-CON) CR tablet 40 mEq (40 mEq Oral Given 10/14/19 0627)  potassium chloride 10 MEQ/100ML IVPB (  Started During Downtime 10/14/19 0153)    ED Course  I have reviewed the triage vital signs and the nursing notes.  Pertinent labs & imaging results that were available during my care of the patient were reviewed by me and considered in my medical decision making (see chart for details).  Clinical Course as of Oct 16 1311  Wed Oct 13, 2019  1722 Patient's care to be transferred to Dr. Stevie Kern.  If patient's daughter is amenable for discharge, then patient will need to come off of trazodone, Xanax and Celexa.  I have also informed this verbally to the nurse practitioner working at the facility where patient is residing.  Her QTC is 502 on repeat EKG.   [AN]    Clinical Course User Index [AN] Derwood Kaplan, MD   MDM Rules/Calculators/A&P                      DDx includes: Orthostatic hypotension Stroke Vertebral artery dissection/stenosis Dysrhythmia PE Vasovagal/neurocardiogenic syncope Aortic stenosis Valvular disorder/Cardiomyopathy Anemia  Patient comes in a chief complaint of syncope. Patient has history of syncope and was just discharged in the hospital few days back.  She had QT prolongation at that time.  Allegedly she was orthostatic positive per EMS.  We will give her some IV fluids and get basic labs and EKG.  I discussed the case with the nursing facility.  They reported that patient was getting shower when she had a 15-minute episode of syncope.  I went over her medications and patient is getting trazodone, Xanax and also Seroquel.  Reassessment: EKG is showing prolonged QTC.  We will repeat it after hydration. Patient is on trazodone and citalopram, both of which  could cause prolonged QT.   Called daughter -she is on  the way now to the hospital. Final Clinical Impression(s) / ED Diagnoses Final diagnoses:  Syncope and collapse  Prolonged Q-T interval on ECG  Hypomagnesemia  Hypokalemia  Hypocalcemia    Rx / DC Orders ED Discharge Orders         Ordered    Increase activity slowly     Discontinue     10/15/19 1113    Diet - low sodium heart healthy     Discontinue     10/15/19 1113    phosphorus (K PHOS NEUTRAL) [phone] MG tablet  3 times daily     Discontinue  Reprint     10/15/19 1113    Discharge instructions     Discontinue    Comments: 1)Please take prescribed medications as instructed. 2)Do a CBC,BMP and phosphorous check in a week.   10/15/19 1113    No wound care     Discontinue     10/15/19 1113           Derwood Kaplan, MD 10/16/19 1313

## 2019-10-13 NOTE — ED Notes (Signed)
2 unsuccessful IV attempts.

## 2019-10-13 NOTE — ED Notes (Addendum)
Patient arrived via [facility] from [facility] c/o syncope episode x1.  Patient was seen at the hospital for the same last Friday.  Patient orthostatic with EMS.  Patient arrives with yellow DNR form.

## 2019-10-13 NOTE — H&P (Signed)
History and Physical    Amanda Owen GUY:[id number] DOB: 1922-07-08 DOA: 10/13/2019  PCP: Charlesetta Shanks, MD  Patient coming from: [facility]  I have personally briefly reviewed patient's old medical records in [facility]  Chief Complaint: Syncope  HPI: Amanda Owen is a 86 y.o. female with medical history significant for dementia with behavioral disturbance, hypertension, hyperlipidemia, history of CVA who presents to the ED for evaluation after syncopal episode at her facility.  History is limited from patient due to underlying dementia therefore supplemented by EDP, chart review, and daughter by phone.  Per ED notes patient had a witnessed syncopal episode at [facility] nursing facility.  EMS were called and patient had apparent positive orthostatic vital signs.  Patient does not recall episode and currently has no complaints and denies any pain.  Patient was recently admitted from 10/08/2019-10/11/2019 for a syncopal episode.  She had a work-up which showed long QTC and positive orthostatic vital signs.  MRI brain and EEG were unrevealing.  Xanax was discontinued on discharge.  ED Course:  Initial vitals showed BP 154/56, pulse 72, RR 19, temp 98.7 Fahrenheit, SPO2 100% on room air.  Labs are notable for potassium 2.9, magnesium 1.5, sodium 144, chloride 119, bicarb 17, BUN 20, creatinine 0.67, serum glucose 95, calcium 5.8, albumin 2.0, LFTs within normal limits, WBC 7.3, hemoglobin 12.5, platelets 229,000.  Urinalysis showed negative nitrites, large leukocytes, 0-5 RBC/hpf, >50 WBC/hpf, no bacteria microscopy.  SARS-CoV-2 PCR is obtained and pending.  Patient was given 1 L normal saline, 2 g IV magnesium, 1 g IV calcium gluconate, IV K 10 mEq x 4 in ordered to receive oral K 40 mEq.  The hospitalist service was consulted admit for further evaluation management.  Review of Systems:  Unable to obtain full review of systems due to dementia.   Past Medical History:    Diagnosis Date  . Chronic kidney failure   . Depression   . Hyperlipidemia   . Hypertension   . Memory deficit   . Occlusion of carotid artery   . Vitamin deficiency     Past Surgical History:  Procedure Laterality Date  . ABDOMINAL HYSTERECTOMY      Social History:  reports that she has never smoked. She does not have any smokeless tobacco history on file. She reports that she does not drink alcohol. No history on file for drug.  Allergies  Allergen Reactions  . Ace Inhibitors Other (See Comments)    unknown    Family History  Problem Relation Age of Onset  . Heart disease Mother   . Heart disease Sister   . Heart disease Brother      Prior to Admission medications   Medication Sig Start Date End Date Taking? Authorizing Provider  ALPRAZolam (XANAX) 0.25 MG tablet Take 0.25 mg by mouth every 8 (eight) hours as needed for anxiety.   Yes [provider]  aspirin 81 MG chewable tablet Chew 81 mg by mouth daily.   Yes [provider]  atorvastatin (LIPITOR) 40 MG tablet Take 40 mg by mouth daily. 08/27/19  Yes [provider]  citalopram (CELEXA) 20 MG tablet Take 20 mg by mouth daily.     Yes [provider]  dipyridamole-aspirin (AGGRENOX) 200-25 MG per 12 hr capsule Take 1 capsule by mouth 2 (two) times daily.   Yes [provider]  memantine (NAMENDA XR) 28 MG CP24 24 hr capsule Take 28 mg by mouth daily.   Yes [provider]  metoprolol tartrate (LOPRESSOR) 25 MG tablet Take 25 mg by mouth 2 (two) times daily.   Yes [provider]  omeprazole (PRILOSEC) 20 MG capsule Take 20 mg by mouth daily.   Yes [provider]  traZODone (DESYREL) 50 MG tablet Take 25 mg by mouth 2 (two) times daily as needed for sleep.    Yes [provider]  amLODipine (NORVASC) 5 MG tablet Take 1 tablet (5 mg total) by mouth daily. Patient not taking: Reported on 10/13/2019 10/11/19 10/10/20  Marinda Elk, MD     Physical Exam: Vitals:   10/13/19 1730 10/13/19 1744 10/13/19 1800 10/13/19 1830  BP: (!) 176/76 (!) 176/76 (!) 175/78 (!) 164/68  Pulse: 83 84 79 76  Resp: 15 16 (!) 29 17  Temp:      TempSrc:      SpO2: 98% 98% 98% 97%  Weight:      Height:      Exam limited due to underlying dementia. Constitutional: Elderly woman resting in bed with head elevated, NAD, calm, appears comfortable Eyes: PERRL, lids and conjunctivae normal ENMT: Mucous membranes are dry. Posterior pharynx clear of any exudate or lesions.Normal dentition.  Neck: normal, supple, no masses. Respiratory: clear to auscultation anteriorly, no wheezing, no crackles. Normal respiratory effort. No accessory muscle use.  Cardiovascular: Regular rate and rhythm, no murmurs / rubs / gallops. No extremity edema. 2+ pedal pulses. Abdomen: no tenderness, no masses palpated. No hepatosplenomegaly. Bowel sounds positive.  Musculoskeletal: no clubbing / cyanosis. No joint deformity upper and lower extremities. Good ROM, no contractures. Normal muscle tone.  Skin: no rashes, lesions, ulcers. No induration Neurologic: CN 2-12 grossly intact. Sensation intact, moving all extremities equally but otherwise strength exam limited due to cooperation. Psychiatric: Awake, alert, and oriented to self.  Further orientation limited due to dementia.  Labs on Admission: I have personally reviewed following labs and imaging studies  CBC: Recent Labs  Lab 10/09/19 0434 10/09/19 0953 10/10/19 0255 10/11/19 0354 10/13/19 1552  WBC 8.3  --  8.3 9.5 7.3  NEUTROABS 5.9  --   --   --  5.6  HGB 13.3  --  11.8* 13.0 12.5  HCT 42.9 37.5 37.4 41.6 41.0  MCV 96.0  --  93.5 93.9 97.2  PLT 244  --  254 275 229   Basic Metabolic Panel: Recent Labs  Lab 10/09/19 0446 10/10/19 0255 10/11/19 0354 10/13/19 1552  NA 138 143 139 144  K 3.4* 3.7 4.2 2.9*  CL 104 108 104 119*  CO2 23 24 24  17*  GLUCOSE 98 94 93 75  BUN 26* 18 14 20   CREATININE  1.41* 1.17* 1.16* 0.67  CALCIUM 9.0 8.7* 8.7* 5.8*  MG  --   --   --  1.5*   GFR: Estimated Creatinine Clearance: 40.2 mL/min (by C-G formula based on SCr of 0.67 mg/dL). Liver Function Tests: Recent Labs  Lab 10/09/19 0446 10/13/19 1552  AST 23 24  ALT 16 14  ALKPHOS 81 52  BILITOT 0.6 0.7  PROT 6.2* 4.5*  ALBUMIN 2.9* 2.0*   No results for input(s): LIPASE, AMYLASE in the last 168 hours. No results for input(s): AMMONIA in the last 168 hours. Coagulation Profile: No results for input(s): INR, PROTIME in the last 168 hours. Cardiac Enzymes: No results for input(s): CKTOTAL, CKMB, CKMBINDEX, TROPONINI in the last 168 hours. BNP (last 3 results) No results for input(s): PROBNP in the last 8760 hours. HbA1C: No  results for input(s): HGBA1C in the last 72 hours. CBG: Recent Labs  Lab 10/10/19 0801 10/10/19 1113 10/10/19 1615 10/10/19 2149 10/11/19 0739  GLUCAP 90 76 82 76 81   Lipid Profile: No results for input(s): CHOL, HDL, LDLCALC, TRIG, CHOLHDL, LDLDIRECT in the last 72 hours. Thyroid Function Tests: No results for input(s): TSH, T4TOTAL, FREET4, T3FREE, THYROIDAB in the last 72 hours. Anemia Panel: No results for input(s): VITAMINB12, FOLATE, FERRITIN, TIBC, IRON, RETICCTPCT in the last 72 hours. Urine analysis:    Component Value Date/Time   COLORURINE YELLOW 10/13/2019 1704   APPEARANCEUR HAZY (A) 10/13/2019 1704   LABSPEC 1.017 10/13/2019 1704   PHURINE 6.0 10/13/2019 1704   GLUCOSEU NEGATIVE 10/13/2019 1704   HGBUR SMALL (A) 10/13/2019 1704   BILIRUBINUR NEGATIVE 10/13/2019 1704   KETONESUR 5 (A) 10/13/2019 1704   PROTEINUR NEGATIVE 10/13/2019 1704   UROBILINOGEN 1.0 09/19/2010 1709   NITRITE NEGATIVE 10/13/2019 1704   LEUKOCYTESUR LARGE (A) 10/13/2019 1704    Radiological Exams on Admission: No results found.  EKG: Independently reviewed. Sinus rhythm with premature complexes, QTC 546, motion artifact.  QTC more prolonged when compared to prior  EKG.  Assessment/Plan Principal Problem:   Syncope Active Problems:   Dementia (HCC)   Hypokalemia   Hypertension   Hypomagnesemia   Hypocalcemia  Amanda Owen is a 86 y.o. female with medical history significant for dementia with behavioral disturbance, hypertension, hyperlipidemia, history of CVA who is admitted after syncopal episode with multiple electrolyte abnormalities.  Syncope Prolonged QTC: Recent admission for the same.  Orthostatic vital signs positive on EMS arrival per ED documentation. -Continue gentle IV fluid hydration overnight -Monitor on telemetry, hold home Celexa, Namenda, trazodone, and Lopressor -Fall precautions -PT/OT eval  Hypomagnesemia/hypokalemia/hypocalcemia: Corrected calcium is 7.4.  IV repletion begun in the ED.  Add potassium to IV fluids.  Repeat labs in the a.m.  Hypertension: Hold home Lopressor with reported orthostatic hypotension.  Hyperlipidemia: Continue atorvastatin.  History of CVA: Continue Aggrenox and atorvastatin.  Home medication also list aspirin 81 mg, will hold at this time as she is on Aggrenox.  Dementia with behavioral disturbance: Appears stable.  Continue delirium precautions.  Holding home [place] as above.  DVT prophylaxis: Lovenox Code Status: DNR, confirmed with patient's daughter by phone Family Communication: Discussed with patient's daughter, Amanda Owen by phone Disposition Plan: From [facility], likely discharge to prior facility pending electrolyte repletion Consults called: None Admission status:  Status is: Observation  The patient remains OBS appropriate and will d/c before 2 midnights.  Dispo: The patient is from: [facility]              Anticipated d/c is to: SNF              Anticipated d/c date is: 1 day pending adequate electrolyte repletion, IV fluid hydration, and PT/OT eval.              Patient currently is not medically stable to d/c.    Zada Finders MD [facility]  If 7PM-7AM, please contact night-coverage [email or web address]  10/13/2019, 6:53 PM

## 2019-10-13 NOTE — ED Notes (Signed)
Pt resistant to sitting up for removal of clothes and doesn't want to sit for OSVS

## 2019-10-14 ENCOUNTER — Encounter (HOSPITAL_COMMUNITY): Payer: Self-pay | Admitting: Internal Medicine

## 2019-10-14 DIAGNOSIS — I10 Essential (primary) hypertension: Secondary | ICD-10-CM | POA: Diagnosis present

## 2019-10-14 DIAGNOSIS — F0391 Unspecified dementia with behavioral disturbance: Secondary | ICD-10-CM | POA: Diagnosis not present

## 2019-10-14 DIAGNOSIS — Z20822 Contact with and (suspected) exposure to covid-19: Secondary | ICD-10-CM | POA: Diagnosis present

## 2019-10-14 DIAGNOSIS — I951 Orthostatic hypotension: Secondary | ICD-10-CM | POA: Diagnosis present

## 2019-10-14 DIAGNOSIS — Z8249 Family history of ischemic heart disease and other diseases of the circulatory system: Secondary | ICD-10-CM | POA: Diagnosis not present

## 2019-10-14 DIAGNOSIS — G908 Other disorders of autonomic nervous system: Secondary | ICD-10-CM | POA: Diagnosis not present

## 2019-10-14 DIAGNOSIS — R9431 Abnormal electrocardiogram [ECG] [EKG]: Secondary | ICD-10-CM | POA: Diagnosis present

## 2019-10-14 DIAGNOSIS — Z66 Do not resuscitate: Secondary | ICD-10-CM | POA: Diagnosis not present

## 2019-10-14 DIAGNOSIS — R55 Syncope and collapse: Secondary | ICD-10-CM | POA: Diagnosis not present

## 2019-10-14 DIAGNOSIS — E876 Hypokalemia: Secondary | ICD-10-CM | POA: Diagnosis not present

## 2019-10-14 DIAGNOSIS — Z8673 Personal history of transient ischemic attack (TIA), and cerebral infarction without residual deficits: Secondary | ICD-10-CM | POA: Diagnosis not present

## 2019-10-14 DIAGNOSIS — E785 Hyperlipidemia, unspecified: Secondary | ICD-10-CM | POA: Diagnosis present

## 2019-10-14 LAB — CBC
HCT: 34.9 % — ABNORMAL LOW (ref 36.0–46.0)
Hemoglobin: 11.1 g/dL — ABNORMAL LOW (ref 12.0–15.0)
MCH: 29.9 pg (ref 26.0–34.0)
MCHC: 31.8 g/dL (ref 30.0–36.0)
MCV: 94.1 fL (ref 80.0–100.0)
Platelets: 224 10*3/uL (ref 150–400)
RBC: 3.71 MIL/uL — ABNORMAL LOW (ref 3.87–5.11)
RDW: 16.7 % — ABNORMAL HIGH (ref 11.5–15.5)
WBC: 7.5 10*3/uL (ref 4.0–10.5)
nRBC: 0 % (ref 0.0–0.2)

## 2019-10-14 LAB — BASIC METABOLIC PANEL
Anion gap: 7 (ref 5–15)
BUN: 22 mg/dL (ref 8–23)
CO2: 22 mmol/L (ref 22–32)
Calcium: 8.4 mg/dL — ABNORMAL LOW (ref 8.9–10.3)
Chloride: 111 mmol/L (ref 98–111)
Creatinine, Ser: 0.82 mg/dL (ref 0.44–1.00)
GFR calc Af Amer: >60 mL/min (ref >60)
GFR calc non Af Amer: >60 mL/min (ref >60)
Glucose, Bld: 91 mg/dL (ref 70–99)
Potassium: 5 mmol/L (ref 3.5–5.1)
Sodium: 140 mmol/L (ref 135–145)

## 2019-10-14 LAB — MAGNESIUM: Magnesium: 2.5 mg/dL — ABNORMAL HIGH (ref 1.7–2.4)

## 2019-10-14 LAB — MRSA PCR SCREENING: MRSA by PCR: NEGATIVE

## 2019-10-14 LAB — PHOSPHORUS: Phosphorus: 1.6 mg/dL — ABNORMAL LOW (ref 2.5–4.6)

## 2019-10-14 MED ORDER — POTASSIUM CHLORIDE 10 MEQ/100ML IV SOLN
INTRAVENOUS | Status: AC
  Filled 2019-10-14: qty 100

## 2019-10-14 MED ORDER — POTASSIUM CHLORIDE 10 MEQ/100ML IV SOLN
INTRAVENOUS | Status: AC
  Administered 2019-10-14: 10 meq via INTRAVENOUS
  Filled 2019-10-14: qty 100

## 2019-10-14 MED ORDER — K PHOS MONO-SOD PHOS DI & MONO 155-852-130 MG PO TABS
500.0000 mg | ORAL_TABLET | Freq: Three times a day (TID) | ORAL | Status: DC
  Administered 2019-10-14 – 2019-10-15 (×4): 500 mg via ORAL
  Filled 2019-10-14 (×5): qty 2

## 2019-10-14 MED ORDER — SODIUM CHLORIDE 0.9 % IV SOLN: INTRAVENOUS | Status: DC

## 2019-10-14 NOTE — Progress Notes (Signed)
Patient complained of pain in lower extremities. Pt was not agreeable to standing due increased pain in right ankle and left heel. Will update provider.

## 2019-10-14 NOTE — Consult Note (Addendum)
WOC Nurse Consult Note: Reason for Consult: Consult requested for left foot. Left anterior inner foot with dark red-purple deep tissue pressure injury; 1X1cm Left heel with with dark red-purple deep tissue pressure injury; 6X5cm Pressure Injury POA: Yes Dressing procedure/placement/frequency: Deep tissue injuries are high risk to evolve into full thickness tissue loss within 7-10 days, No topical treatment is indicated at this time since skin is intact, except to float heels to reduce pressure to the affected locations.  Please re-consult if further assistance is needed.  Thank-you,  Cammie Mcgee MSN, RN, CWOCN, [place], CNS [phone]

## 2019-10-14 NOTE — Progress Notes (Signed)
Call placed to patients daughter [name]. History, vaccine information verified. Questions concerns denied regarding POC at this time. Daughter awaiting call from provider.

## 2019-10-14 NOTE — TOC Initial Note (Signed)
Transition of Care [facility]) - Initial/Assessment Note    Patient Details  Name: Amanda Owen MRN: [id number] Date of Birth: Jan 02, 1923  Transition of Care [facility]) CM/SW Contact:    Lanier Clam, RN Phone Number: 10/14/2019, 10:05 AM  Clinical Narrative: Patient w/dementia-spoke to dtr [name]-d/c plans to return back to ALF/memory care w/palliative care services-[facility]-rep Melissa aware. Sent fl2, await d/c summary to send. DNR.PTAR @ d/c.                  Expected Discharge Plan: Memory Care Barriers to Discharge: Continued Medical Work up   Patient Goals and CMS Choice Patient states their goals for this hospitalization and ongoing recovery are:: return back to ALF memory care CMS Medicare.gov Compare Post Acute Care list provided to:: Patient Represenative (must comment) (dtr) Choice offered to / list presented to : Adult Children  Expected Discharge Plan and Services Expected Discharge Plan: Memory Care   Discharge Planning Services: CM Consult   Living arrangements for the past 2 months: Assisted Living Facility                                      Prior Living Arrangements/Services Living arrangements for the past 2 months: Assisted Living Facility Lives with:: Facility Resident Patient language and need for interpreter reviewed:: Yes Do you feel safe going back to the place where you live?: Yes      Need for Family Participation in Patient Care: No (Comment) Care giver support system in place?: Yes (comment)   Criminal Activity/Legal Involvement Pertinent to Current Situation/Hospitalization: No - Comment as needed  Activities of Daily Living Home Assistive Devices/Equipment: Other (Comment) (pt unable to answer) ADL Screening (condition at time of admission) Patient's cognitive ability adequate to safely complete daily activities?: No Is the patient deaf or have difficulty hearing?: No Does the patient have difficulty seeing, even when wearing  glasses/contacts?: No Does the patient have difficulty concentrating, remembering, or making decisions?: Yes Patient able to express need for assistance with ADLs?: No Does the patient have difficulty dressing or bathing?: Yes Independently performs ADLs?: No Communication: Independent Dressing (OT): Needs assistance Is this a change from baseline?: Pre-admission baseline Grooming: Needs assistance Is this a change from baseline?: Pre-admission baseline Feeding: Needs assistance Is this a change from baseline?: Pre-admission baseline Bathing: Needs assistance Is this a change from baseline?: Pre-admission baseline Toileting: Needs assistance Is this a change from baseline?: Pre-admission baseline In/Out Bed: Needs assistance Is this a change from baseline?: Pre-admission baseline Walks in Home: Needs assistance Is this a change from baseline?: Pre-admission baseline Does the patient have difficulty walking or climbing stairs?: Yes Weakness of Legs: Both Weakness of Arms/Hands: Both  Permission Sought/Granted Permission sought to share information with : Case Manager Permission granted to share information with : Yes, Verbal Permission Granted  Share Information with NAME: Case Manager  Permission granted to share info w AGENCY: [facility]-ALF/Memory care  Permission granted to share info w Relationship: dtr [name] [phone]     Emotional Assessment Appearance:: Appears stated age Attitude/Demeanor/Rapport: Gracious Affect (typically observed): Accepting Orientation: : Oriented to Self Alcohol / Substance Use: Not Applicable Psych Involvement: No (comment)  Admission diagnosis:  Hypocalcemia [E83.51] Syncope and collapse [R55] Hypokalemia [E87.6] Hypomagnesemia [E83.42] Syncope [R55] Prolonged Q-T interval on ECG [R94.31] Patient Active Problem List   Diagnosis Date Noted  . Hypomagnesemia 10/13/2019  . Hypocalcemia 10/13/2019  .  Recurrent syncope 10/09/2019   . AKI (acute kidney injury) ([place]) 10/09/2019  . History of completed stroke 10/09/2019  . Dementia ([place]) 10/09/2019  . Elevated troponin 10/09/2019  . Hypokalemia 10/09/2019  . Bacteriuria 10/09/2019  . Hypertension   . Acute cystitis without hematuria   . Syncope   . Chronic cough 07/31/2010   PCP:  De Nurse, MD Pharmacy:   [place], [place] [address] Phone: [phone] Fax: [phone]     Social Determinants of Health (SDOH) Interventions    Readmission Risk Interventions No flowsheet data found.

## 2019-10-14 NOTE — Progress Notes (Signed)
Occupational Therapy Evaluation Patient Details Name: Amanda Owen MRN: [id number] DOB: [birth year] Today's Date: 10/14/2019    History of Present Illness Severa Amanda Owen is an 86 y.o. female past medical history significant for dementia, essential hypertension.  Recent admission 6/4-10/11/19 for syncope. Pt admitted from ALF memory care unit for syncope.   Clinical Impression   Limited evaluation, as therapy attempt to sit patient up at edge of bed patient calls out and pushes back with increased risk of sliding off edge of bed therefore quickly returned to supine. When asked what was wrong patient stating pain in her lower back. Patient also having pain to touch in L ankle/lateral foot, made RN aware. Spoke with CM states ALF has been providing patient care at current level in memory care unit. Patient unable to provide history however likely close to baseline with self care therefore will discontinue acute OT services at this time.   Follow Up Recommendations  Supervision/Assistance - 24 hour;Other (comment) (back to ALF)    Equipment Recommendations  None recommended by OT       Precautions / Restrictions Precautions Precautions: Fall Restrictions Weight Bearing Restrictions: No      Mobility Bed Mobility Overal bed mobility: Needs Assistance Bed Mobility: Supine to Sit;Sit to Supine     Supine to sit: +2 for physical assistance;Total assist Sit to supine: Total assist;+2 for physical assistance   General bed mobility comments: Pt was unable to come fully to EOB even with total assist due to LE pain. Pt came 3/4 of way to full sit but was yelling in pain and resisting movement.  Could not get her to EOB due to pt resistant.  Transfers                 General transfer comment: Unable     Balance Overall balance assessment: Needs assistance Sitting-balance support: Bilateral upper extremity supported;Feet unsupported Sitting balance-Leahy Scale: Zero Sitting balance -  Comments: unable to come to full sit with +2 total assist 2* pain                                   ADL either performed or assessed with clinical judgement   ADL Overall ADL's : At baseline                                       General ADL Comments: unable to obtain info from patient however per chart patient was from memory care unit and likely had extensive assistance with self care at baseline                  Pertinent Vitals/Pain Pain Assessment: Faces Faces Pain Scale: Hurts whole lot Pain Location: L lateral ankle with touch/palpation, L heel Pain Descriptors / Indicators: Grimacing;Guarding;Moaning Pain Intervention(s): Monitored during session;Other (comment) (notified RN)     Hand Dominance Right   Extremity/Trunk Assessment Upper Extremity Assessment Upper Extremity Assessment: Generalized weakness   Lower Extremity Assessment Lower Extremity Assessment: Defer to PT evaluation   Cervical / Trunk Assessment Cervical / Trunk Assessment: Kyphotic   Communication Communication Communication: No difficulties   Cognition Arousal/Alertness: Awake/alert Behavior During Therapy: Flat affect Overall Cognitive Status: No family/caregiver present to determine baseline cognitive functioning Area of Impairment: Safety/judgement;Problem solving;Following commands;Memory;Orientation;Attention  Orientation Level: Place;Time;Situation;Disoriented to Current Attention Level: Focused Memory: Decreased short-term memory Following Commands: Follows one step commands inconsistently;Follows one step commands with increased time Safety/Judgement: Decreased awareness of safety;Decreased awareness of deficits   Problem Solving: Slow processing;Decreased initiation;Difficulty sequencing;Requires verbal cues;Requires tactile cues                Home Living Family/patient expects to be discharged to:: Assisted living                                  Additional Comments: Pt could not answer questions 2* confusion      Prior Functioning/Environment Level of Independence: Needs assistance  Gait / Transfers Assistance Needed: Unsure but pt was in A living PTA  ADL's / Homemaking Assistance Needed: Assume assist with B/D   Comments: Was living in memory care A living PTA        OT Problem List: Pain                       Co-evaluation PT/OT/SLP Co-Evaluation/Treatment: Yes Reason for Co-Treatment: Necessary to address cognition/behavior during functional activity;For patient/therapist safety;To address functional/ADL transfers   OT goals addressed during session: ADL's and self-care      AM-PAC OT "6 Clicks" Daily Activity     Outcome Measure Help from another person eating meals?: A Lot Help from another person taking care of personal grooming?: A Lot Help from another person toileting, which includes using toliet, bedpan, or urinal?: Total Help from another person bathing (including washing, rinsing, drying)?: Total Help from another person to put on and taking off regular upper body clothing?: Total Help from another person to put on and taking off regular lower body clothing?: Total 6 Click Score: 8   End of Session Nurse Communication: Mobility status;Other (comment) (pain in L foot)  Activity Tolerance: Patient limited by pain Patient left: in bed;with call bell/phone within reach;with bed alarm set;with nursing/sitter in room  OT Visit Diagnosis: Pain Pain - Right/Left: Left Pain - part of body: Ankle and joints of foot                Time: 0922-0940 OT Time Calculation (min): 18 min Charges:  OT General Charges $OT Visit: 1 Visit OT Evaluation $OT Eval Low Complexity: 1 Low  Marlyce Huge OT Pager: [phone]  Carmelia Roller 10/14/2019, 2:20 PM

## 2019-10-14 NOTE — Progress Notes (Signed)
PROGRESS NOTE    Amanda Owen  [id number] DOB: 26-Aug-1922 DOA: 10/13/2019 PCP: De Nurse, MD   Brief Narrative:  Patient is a 32 female with history of dementia, behavioral disturbance, hypertension, hyperlipidemia, history of CVA who presents to the emerge department for evaluation of syncopal episode at her facility.  History was limited because of her underlying dementia.  As per the ED notes, patient had a witnessed syncopal episode at [facility] facility.  She was found to be orthostatic.  Patient was recently admitted from 10/08/2019-10/11/2019 for a syncopal episode.  She had a work-up which showed long QTC and positive orthostatic vital signs.  MRI brain and EEG were unrevealing.  Xanax was discontinued on discharge. Patient was admitted for the evaluation of syncope .This morning she is hemodynamically stable.  Assessment & Plan:   Principal Problem:   Syncope Active Problems:   Dementia (HCC)   Hypokalemia   Hypertension   Hypomagnesemia   Hypocalcemia   Syncope: Recent admission for the same.  Currently hemodynamically stable.  Blood pressure stable.  Continue gentle IV fluids.  She was on Celexa, Namenda, trazodone and Lopressor at her baseline.  Continue fall precaution.  PT/OT evaluation requested. We attempted to check orthostatic vitals,but she cudnt stand. She has high risk of falling and syncope in the future likely this is from autonomic dysfunction and unfortunately we may not be able to fix that. She is hypertensive so not a candidate for midodrine.  Will recommend to use TED hose.  She can use abdominal binder also.  She needs to be supervised 24 hours when she walks or sits.  Prolonged QTc: QTC of 546 today.  Will discontinue Celexa permanently.  Check EKG tomorrow  Hypomagnesemia/hypokalemia/hypocalcemia/ hypophosphatemia: Supplemented Continue to monitor the low pulse.  Hypertension: Currently stable.  She was hypertensive on presentation.   Lopressor on hold.  Hyperlipidemia: Continue Lipitor  History of CVA: On Aggrenox and Lipitor.  Dementia with behavioral disturbance: Continue supportive care.  Takes Namenda at home.  Continue delirium precautions.  Goals of care was discussed on her previous admission regarding her advanced age and multiple comorbidities.  She is DNR.         DVT prophylaxis:Lovenox Code Status: DNR Family Communication: Discussed with detail with daughter on phone today. Status is: Observation  The patient remains OBS appropriate and will d/c before 2 midnights.  Dispo: The patient is from: SNF              Anticipated d/c is to: SNF              Anticipated d/c date is: 1 day              Patient currently is not medically stable to d/c.   Consultants: None  Procedures:None  Antimicrobials:  Anti-infectives (From admission, onward)   None      Subjective: Patient seen and examined at the bedside this morning.  Hemodynamically stable.  Lying in the bed.  Alert and awake but not oriented.  Not in any kind of distress.  Since physical therapy assessed her .  Objective: Vitals:   10/13/19 2030 10/13/19 2141 10/14/19 0100 10/14/19 0638  BP: (!) 156/90 (!) 158/75 (!) 155/60 140/66  Pulse: 83 82 80 86  Resp: 19 16    Temp:  98.4 F (36.9 C) 98.6 F (37 C) 99 F (37.2 C)  TempSrc:  Oral Oral Oral  SpO2: 100% 98% 97% 99%  Weight:  Height:        Intake/Output Summary (Last 24 hours) at 10/14/2019 0811 Last data filed at 10/14/2019 0600 Gross per 24 hour  Intake 650 ml  Output --  Net 650 ml   Filed Weights   10/13/19 1531  Weight: 66 kg    Examination:  General exam: Debilitated, deconditioned elderly female HEENT:PERRL,Oral mucosa moist, Ear/Nose normal on gross exam Respiratory system: Bilateral equal air entry, normal vesicular breath sounds, no wheezes or crackles  Cardiovascular system: S1 & S2 heard, RRR. No JVD, murmurs, rubs, gallops or clicks. No pedal  edema. Gastrointestinal system: Abdomen is nondistended, soft and nontender. No organomegaly or masses felt. Normal bowel sounds heard. Central nervous system: Alert and awake but not oriented Extremities: No edema, no clubbing ,no cyanosis, tenderness on the left ankle Skin: No rashes, lesions or ulcers,no icterus ,no pallor   Data Reviewed: I have personally reviewed following labs and imaging studies  CBC: Recent Labs  Lab 10/09/19 0434 10/09/19 0953 10/10/19 0255 10/11/19 0354 10/13/19 1552 10/14/19 0549  WBC 8.3  --  8.3 9.5 7.3 7.5  NEUTROABS 5.9  --   --   --  5.6  --   HGB 13.3  --  11.8* 13.0 12.5 11.1*  HCT 42.9 37.5 37.4 41.6 41.0 34.9*  MCV 96.0  --  93.5 93.9 97.2 94.1  PLT 244  --  254 275 229 224   Basic Metabolic Panel: Recent Labs  Lab 10/09/19 0446 10/09/19 0446 10/10/19 0255 10/11/19 0354 10/13/19 1552 10/13/19 1920 10/14/19 0549  NA 138  --  143 139 144  --  140  K 3.4*  --  3.7 4.2 2.9*  --  5.0  CL 104  --  108 104 119*  --  111  CO2 23  --  24 24 17*  --  22  GLUCOSE 98  --  94 93 75  --  91  BUN 26*  --  18 14 20   --  22  CREATININE 1.41*  --  1.17* 1.16* 0.67  --  0.82  CALCIUM 9.0   < > 8.7* 8.7* 5.8* 8.7* 8.4*  MG  --   --   --   --  1.5*  --  2.5*  PHOS  --   --   --   --   --   --  1.6*   < > = values in this interval not displayed.   GFR: Estimated Creatinine Clearance: 39.2 mL/min (by C-G formula based on SCr of 0.82 mg/dL). Liver Function Tests: Recent Labs  Lab 10/09/19 0446 10/13/19 1552  AST 23 24  ALT 16 14  ALKPHOS 81 52  BILITOT 0.6 0.7  PROT 6.2* 4.5*  ALBUMIN 2.9* 2.0*   No results for input(s): LIPASE, AMYLASE in the last 168 hours. No results for input(s): AMMONIA in the last 168 hours. Coagulation Profile: No results for input(s): INR, PROTIME in the last 168 hours. Cardiac Enzymes: No results for input(s): CKTOTAL, CKMB, CKMBINDEX, TROPONINI in the last 168 hours. BNP (last 3 results) No results for  input(s): PROBNP in the last 8760 hours. HbA1C: No results for input(s): HGBA1C in the last 72 hours. CBG: Recent Labs  Lab 10/10/19 0801 10/10/19 1113 10/10/19 1615 10/10/19 2149 10/11/19 0739  GLUCAP 90 76 82 76 81   Lipid Profile: No results for input(s): CHOL, HDL, LDLCALC, TRIG, CHOLHDL, LDLDIRECT in the last 72 hours. Thyroid Function Tests: No results for input(s): TSH, T4TOTAL, FREET4,  T3FREE, THYROIDAB in the last 72 hours. Anemia Panel: No results for input(s): VITAMINB12, FOLATE, FERRITIN, TIBC, IRON, RETICCTPCT in the last 72 hours. Sepsis Labs: No results for input(s): PROCALCITON, LATICACIDVEN in the last 168 hours.  Recent Results (from the past 240 hour(s))  Urine culture     Status: Abnormal   Collection Time: 10/09/19 12:13 AM   Specimen: Urine, Catheterized  Result Value Ref Range Status   Specimen Description URINE, CATHETERIZED  Final   Special Requests   Final    NONE Performed at [facility], [address], [place], [place] [address]    Culture >=100,000 COLONIES/mL ESCHERICHIA COLI (A)  Final   Report Status 10/11/2019 FINAL  Final   Organism ID, Bacteria ESCHERICHIA COLI (A)  Final      Susceptibility   Escherichia coli - MIC*    AMPICILLIN <=2 SENSITIVE Sensitive     CEFAZOLIN <=4 SENSITIVE Sensitive     CEFTRIAXONE <=1 SENSITIVE Sensitive     CIPROFLOXACIN <=0.25 SENSITIVE Sensitive     GENTAMICIN <=1 SENSITIVE Sensitive     IMIPENEM <=0.25 SENSITIVE Sensitive     NITROFURANTOIN <=16 SENSITIVE Sensitive     TRIMETH/SULFA <=20 SENSITIVE Sensitive     AMPICILLIN/SULBACTAM <=2 SENSITIVE Sensitive     PIP/TAZO <=4 SENSITIVE Sensitive     * >=100,000 COLONIES/mL ESCHERICHIA COLI  SARS Coronavirus 2 by RT PCR (hospital order, performed in [facility] hospital lab) Nasopharyngeal Nasopharyngeal Swab     Status: None   Collection Time: 10/09/19  7:49 AM   Specimen: Nasopharyngeal Swab  Result Value Ref Range Status   SARS Coronavirus 2  NEGATIVE NEGATIVE Final    Comment: (NOTE) SARS-CoV-2 target nucleic acids are NOT DETECTED. The SARS-CoV-2 RNA is generally detectable in upper and lower respiratory specimens during the acute phase of infection. The lowest concentration of SARS-CoV-2 viral copies this assay can detect is 250 copies / mL. A negative result does not preclude SARS-CoV-2 infection and should not be used as the sole basis for treatment or other patient management decisions.  A negative result may occur with improper specimen collection / handling, submission of specimen other than nasopharyngeal swab, presence of viral mutation(s) within the areas targeted by this assay, and inadequate number of viral copies (<250 copies / mL). A negative result must be combined with clinical observations, patient history, and epidemiological information. Fact Sheet for Patients:   BoilerBrush.com.cy Fact Sheet for Healthcare Providers: https://pope.com/ This test is not yet approved or cleared  by the [place] FDA and has been authorized for detection and/or diagnosis of SARS-CoV-2 by FDA under an Emergency Use Authorization (EUA).  This EUA will remain in effect (meaning this test can be used) for the duration of the COVID-19 declaration under Section 564(b)(1) of the Act, 21 U.S.C. section 360bbb-3(b)(1), unless the authorization is terminated or revoked sooner. Performed at [facility], [address] [place], [place] [address]   SARS Coronavirus 2 by RT PCR (hospital order, performed in [facility] hospital lab) Nasopharyngeal Nasopharyngeal Swab     Status: None   Collection Time: 10/13/19  6:15 PM   Specimen: Nasopharyngeal Swab  Result Value Ref Range Status   SARS Coronavirus 2 NEGATIVE NEGATIVE Final    Comment: (NOTE) SARS-CoV-2 target nucleic acids are NOT DETECTED. The SARS-CoV-2 RNA is generally detectable in upper and lower respiratory  specimens during the acute phase of infection. The lowest concentration of SARS-CoV-2 viral copies this assay can detect is  250 copies / mL. A negative result does not preclude SARS-CoV-2 infection and should not be used as the sole basis for treatment or other patient management decisions.  A negative result may occur with improper specimen collection / handling, submission of specimen other than nasopharyngeal swab, presence of viral mutation(s) within the areas targeted by this assay, and inadequate number of viral copies (<250 copies / mL). A negative result must be combined with clinical observations, patient history, and epidemiological information. Fact Sheet for Patients:   BoilerBrush.com.cy Fact Sheet for Healthcare Providers: https://pope.com/ This test is not yet approved or cleared  by the [place] FDA and has been authorized for detection and/or diagnosis of SARS-CoV-2 by FDA under an Emergency Use Authorization (EUA).  This EUA will remain in effect (meaning this test can be used) for the duration of the COVID-19 declaration under Section 564(b)(1) of the Act, 21 U.S.C. section 360bbb-3(b)(1), unless the authorization is terminated or revoked sooner. Performed at [facility], [address] [place], [place] [address]   MRSA PCR Screening     Status: None   Collection Time: 10/14/19  5:30 AM   Specimen: Nasal Mucosa; Nasopharyngeal  Result Value Ref Range Status   MRSA by PCR NEGATIVE NEGATIVE Final    Comment:        The GeneXpert MRSA Assay (FDA approved for NASAL specimens only), is one component of a comprehensive MRSA colonization surveillance program. It is not intended to diagnose MRSA infection nor to guide or monitor treatment for MRSA infections. Performed at [facility], [address] [place], [place] [address]          Radiology Studies: No results  found.      Scheduled Meds: . atorvastatin  40 mg Oral Daily  . dipyridamole-aspirin  1 capsule Oral BID  . enoxaparin (LOVENOX) injection  40 mg Subcutaneous Q24H  . pantoprazole  40 mg Oral Daily  . sodium chloride flush  3 mL Intravenous Q12H   Continuous Infusions:   LOS: 0 days    Time spent: More than 50% of that time was spent in counseling and/or coordination of care.      Burnadette Pop, MD [facility] P6/02/2020, 8:11 AM

## 2019-10-14 NOTE — Evaluation (Signed)
Physical Therapy Evaluation Patient Details Name: Amanda Owen MRN: [id number] DOB: 03-Feb-1923 Today's Date: 10/14/2019   History of Present Illness  Amanda Owen is an 86 y.o. female past medical history significant for dementia, essential hypertension.  Recent admission 6/4-10/11/19 for syncope. Pt admitted from ALF memory care unit for syncope.  Clinical Impression  Pt admitted with above diagnosis. Attempted supine to sit with +2 total assist, pt came 3/4 of way to sitting but unable to come to full upright position 2* pain and active resistance to movement. Noted pt has pain at L lateral ankle and L heel. L heel is discolored, RN notified and heels floated. L lateral ankle appears swollen compared to R. Pt unable to provide hx 2* confusion. Per case manager, pt's ALF is able to manage her care at present functional level, plan is to return there.  Pt currently with functional limitations due to the deficits listed below (see PT Problem List). Pt will benefit from skilled PT to increase their independence and safety with mobility to allow discharge to the venue listed below.       Follow Up Recommendations Home health PT;Supervision/Assistance - 24 hour    Equipment Recommendations  None recommended by PT    Recommendations for Other Services       Precautions / Restrictions Precautions Precautions: Fall Restrictions Weight Bearing Restrictions: No      Mobility  Bed Mobility Overal bed mobility: Needs Assistance Bed Mobility: Supine to Sit;Sit to Supine     Supine to sit: +2 for physical assistance;Total assist Sit to supine: Total assist;+2 for physical assistance   General bed mobility comments: Pt was unable to come fully to EOB even with total assist due to LE pain. Pt came 3/4 of way to full sit but was yelling in pain and resisting movement.  Could not get her to EOB due to pt resistant.  Transfers                 General transfer comment: Unable    Ambulation/Gait                Stairs            Wheelchair Mobility    Modified Rankin (Stroke Patients Only)       Balance Overall balance assessment: Needs assistance   Sitting balance-Leahy Scale: Zero Sitting balance - Comments: unable to come to full sit with +2 total assist 2* pain                                     Pertinent Vitals/Pain Pain Location: L lateral ankle with touch/palpation, L heel Pain Descriptors / Indicators: Grimacing;Guarding;Moaning Pain Intervention(s): Limited activity within patient's tolerance;Monitored during session;Repositioned;Other (comment) (floated heels, RN notified of pain and of L heel discoloration/pain)    Home Living Family/patient expects to be discharged to:: Assisted living               Home Equipment:  (unsure as pt poor historian) Additional Comments: Pt could not answer questions 2* confusion    Prior Function Level of Independence: Needs assistance   Gait / Transfers Assistance Needed: Unsure but pt was in A living PTA   ADL's / Homemaking Assistance Needed: Assume assist with B/D  Comments: Was living in memory care A living PTA     Hand Dominance        Extremity/Trunk Assessment  Upper Extremity Assessment Upper Extremity Assessment: Defer to OT evaluation    Lower Extremity Assessment Lower Extremity Assessment: Generalized weakness;Difficult to assess due to impaired cognition (SLR on R -3/5, LLE limited 2* pain with movement L ankle)    Cervical / Trunk Assessment Cervical / Trunk Assessment: Kyphotic (difficult to fully assess, pt did not come to a full sit)  Communication   Communication:  (slurred speech, sometimes did not respond to questions/commands)  Cognition Arousal/Alertness: Awake/alert Behavior During Therapy: Flat affect Overall Cognitive Status: No family/caregiver present to determine baseline cognitive functioning Area of Impairment:  Safety/judgement;Problem solving;Following commands;Memory;Orientation;Attention                 Orientation Level: Place;Time;Situation     Following Commands: Follows one step commands inconsistently;Follows one step commands with increased time Safety/Judgement: Decreased awareness of safety;Decreased awareness of deficits   Problem Solving: Slow processing;Decreased initiation;Difficulty sequencing;Requires verbal cues;Requires tactile cues        General Comments General comments (skin integrity, edema, etc.): L heel has purple and red discoloration, tender to palpation; floated heels and notified RN    Exercises     Assessment/Plan    PT Assessment Patient needs continued PT services  PT Problem List Decreased activity tolerance;Decreased balance;Decreased mobility;Decreased knowledge of use of DME;Decreased safety awareness;Decreased knowledge of precautions;Pain;Decreased strength       PT Treatment Interventions DME instruction;Gait training;Functional mobility training;Therapeutic activities;Therapeutic exercise;Balance training;Patient/family education    PT Goals (Current goals can be found in the Care Plan section)  Acute Rehab PT Goals Patient Stated Goal: unable to state PT Goal Formulation: Patient unable to participate in goal setting Time For Goal Achievement: 10/28/19 Potential to Achieve Goals: Fair    Frequency Min 2X/week   Barriers to discharge Decreased caregiver support      Co-evaluation PT/OT/SLP Co-Evaluation/Treatment: Yes             AM-PAC PT "6 Clicks" Mobility  Outcome Measure Help needed turning from your back to your side while in a flat bed without using bedrails?: Total Help needed moving from lying on your back to sitting on the side of a flat bed without using bedrails?: Total Help needed moving to and from a bed to a chair (including a wheelchair)?: Total Help needed standing up from a chair using your arms (e.g.,  wheelchair or bedside chair)?: Total Help needed to walk in hospital room?: Total Help needed climbing 3-5 steps with a railing? : Total 6 Click Score: 6    End of Session   Activity Tolerance: Patient limited by pain (self limiting) Patient left: in bed;with call bell/phone within reach;with bed alarm set;with restraints reapplied;with nursing/sitter in room Nurse Communication: Mobility status PT Visit Diagnosis: Unsteadiness on feet (R26.81);Pain;Muscle weakness (generalized) (M62.81) Pain - Right/Left: Left Pain - part of body: Ankle and joints of foot (bilaterally)    Time: 4193-7902 PT Time Calculation (min) (ACUTE ONLY): 19 min   Charges:   PT Evaluation $PT Eval Low Complexity: 1 Low         Ralene Bathe Kistler PT 10/14/2019  Acute Rehabilitation Services Pager [phone] Office [phone]

## 2019-10-14 NOTE — Plan of Care (Signed)
Pt has hx of dementia with behaviors Problem: Education: Goal: Knowledge of General Education information will improve Description: Including pain rating scale, medication(s)/side effects and non-pharmacologic comfort measures Outcome: Not Progressing   Problem: Health Behavior/Discharge Planning: Goal: Ability to manage health-related needs will improve Outcome: Not Progressing   Problem: Clinical Measurements: Goal: Ability to maintain clinical measurements within normal limits will improve Outcome: Not Progressing   Problem: Clinical Measurements: Goal: Diagnostic test results will improve Outcome: Not Progressing   Problem: Clinical Measurements: Goal: Cardiovascular complication will be avoided Outcome: Not Progressing   Problem: Activity: Goal: Risk for activity intolerance will decrease Outcome: Not Progressing

## 2019-10-14 NOTE — NC FL2 (Signed)
[place] MEDICAID FL2 LEVEL OF CARE SCREENING TOOL     IDENTIFICATION  Patient Name: Amanda Owen Birthdate: 01-11-1923 Sex: female Admission Date (Current Location): 10/13/2019  [facility] and [place] Number:     Human resources officer and Address:      [facility]/ALF   Provider Number:    Attending Physician Name and Address:  Burnadette Pop, MD  Relative Name and Phone Number:   [name] [phone]    Current Level of Care:   Recommended Level of Care:  Assisted Living Facility/Memory care Prior Approval Number:    Date Approved/Denied:   PASRR Number:    Discharge Plan:  ALF/memory care/[facility]    Current Diagnoses: Patient Active Problem List   Diagnosis Date Noted  . Hypomagnesemia 10/13/2019  . Hypocalcemia 10/13/2019  . Recurrent syncope 10/09/2019  . AKI (acute kidney injury) (HCC) 10/09/2019  . History of completed stroke 10/09/2019  . Dementia (HCC) 10/09/2019  . Elevated troponin 10/09/2019  . Hypokalemia 10/09/2019  . Bacteriuria 10/09/2019  . Hypertension   . Acute cystitis without hematuria   . Syncope   . Chronic cough 07/31/2010    Orientation RESPIRATION BLADDER Height & Weight            Weight: 66 kg Height:  5\' 4"  (162.6 cm)  BEHAVIORAL SYMPTOMS/MOOD NEUROLOGICAL BOWEL NUTRITION STATUS          Diet - low sodium heart healthy    AMBULATORY STATUS COMMUNICATION OF NEEDS Skin                               Personal Care Assistance Level of Assistance              Functional Limitations Info             SPECIAL CARE FACTORS FREQUENCY                       Contractures      Additional Factors Info                    Discharge Medications: Also see discharge summary for a list of discharge medications.  Medication List    TAKE these medications   ALPRAZolam 0.25 MG tablet Commonly known as: XANAX Take 0.25 mg by mouth every 8 (eight) hours as needed for anxiety.   amLODipine 5 MG  tablet Commonly known as: NORVASC Take 1 tablet (5 mg total) by mouth daily.   aspirin 81 MG chewable tablet Chew 81 mg by mouth daily.   atorvastatin 40 MG tablet Commonly known as: LIPITOR Take 40 mg by mouth daily.   citalopram 20 MG tablet Commonly known as: CELEXA Take 20 mg by mouth daily.   dipyridamole-aspirin 200-25 MG 12hr capsule Commonly known as: AGGRENOX Take 1 capsule by mouth 2 (two) times daily.   metoprolol tartrate 25 MG tablet Commonly known as: LOPRESSOR Take 25 mg by mouth 2 (two) times daily.   Namenda XR 28 MG Cp24 24 hr capsule Generic drug: memantine Take 28 mg by mouth daily.   omeprazole 20 MG capsule Commonly known as: PRILOSEC Take 20 mg by mouth daily.   traZODone 50 MG tablet Commonly known as: DESYREL Take 25-50 mg by mouth See admin instructions. Take 1/2 tablet twice a day as needed for agitation and 1 tablet at bedtime.      Relevant Imaging Results:  Relevant  Lab Results:   Additional Information    Teauna Dubach, Juliann Pulse, RN

## 2019-10-14 NOTE — ED Provider Notes (Addendum)
Signout note  86 year old lady with syncope, recently admitted for similar episode.  Today noted to have prolonged QT, labs pending at time of signout.  Reviewed labs, hypomagnesium, hypokalemia, hypocalcemia.  Ordered replacement, updated patient and daughter, given prolonged QT and electrolyte derangements, will admit to hospitalist for further management.  Dr. Allena Katz accepting.   Milagros Loll, MD 10/14/19 1313    Milagros Loll, MD 10/14/19 1314

## 2019-10-14 NOTE — Care Management Obs Status (Signed)
MEDICARE OBSERVATION STATUS NOTIFICATION   Patient Details  Name: Amanda Owen MRN: [id number] Date of Birth: 02-15-1923   Medicare Observation Status Notification Given:  Yes    Lanier Clam, RN 10/14/2019, 9:37 AM

## 2019-10-15 LAB — PTH, INTACT AND CALCIUM
Calcium, Total (PTH): 8.1 mg/dL — ABNORMAL LOW (ref 8.7–10.3)
PTH: 15 pg/mL (ref 15–65)

## 2019-10-15 LAB — PHOSPHORUS: Phosphorus: 2 mg/dL — ABNORMAL LOW (ref 2.5–4.6)

## 2019-10-15 MED ORDER — METOPROLOL TARTRATE 25 MG PO TABS
25.0000 mg | ORAL_TABLET | Freq: Two times a day (BID) | ORAL | Status: DC
  Administered 2019-10-15: 25 mg via ORAL
  Filled 2019-10-15: qty 1

## 2019-10-15 MED ORDER — K PHOS MONO-SOD PHOS DI & MONO 155-852-130 MG PO TABS: 500.0000 mg | ORAL_TABLET | Freq: Three times a day (TID) | ORAL | 0 refills | Status: DC

## 2019-10-15 NOTE — TOC Transition Note (Signed)
Transition of Care [facility]) - CM/SW Discharge Note   Patient Details  Name: Amanda Owen MRN: [id number] Date of Birth: [birth year]  Transition [facility] Care [facility]) CM/SW Contact:  Lanier Clam, RN Phone Number: 10/15/2019, 11:36 AM   Clinical Narrative:  TC [address] spoke to rep about d/c back to [facility] received H&P,fl2,therapy notes,d/c summary going to rm#15,nurse call report #[phone]. PTAR called.Packet w/nsg secy.     Final next level of care: Memory Care Barriers to Discharge: No Barriers Identified   Patient Goals and CMS Choice Patient states their goals for this hospitalization and ongoing recovery are:: return back to ALF memory care CMS Medicare.gov Compare Post Acute Care list provided to:: Patient Represenative (must comment) (dtr) Choice offered to / list presented to : Adult Children  Discharge Placement              Patient chooses bed at:  [facility] ALF/memory care w/palliative care services.) Patient to be transferred to facility by: PTAR Name of family member notified: [name] [phone] Patient and family notified of of transfer: 10/15/19  Discharge Plan and Services   Discharge Planning Services: CM Consult                                 Social Determinants of Health (SDOH) Interventions     Readmission Risk Interventions No flowsheet data found.

## 2019-10-15 NOTE — Progress Notes (Signed)
All Discharge information and instruction provided to Nurse at [facility].  IV removed, patient placed in disposable gown and belongings returned to patient. No further question ask. Patient in room waiting for PTAR.

## 2019-10-15 NOTE — NC FL2 (Signed)
[place] MEDICAID FL2 LEVEL OF CARE SCREENING TOOL     IDENTIFICATION  Patient Name: Amanda Owen Birthdate: 05-11-1922 Sex: female Admission Date (Current Location): 10/13/2019  [facility] and [place] Number:  Producer, television/film/video and Address:  [facility],  [address], [place] [address]      Provider Number: [id number]  Attending Physician Name and Address:  Burnadette Pop, MD  Relative Name and Phone Number:  [name] [phone]    Current Level of Care: Hospital Recommended Level of Care: Memory Care, Assisted Living Facility (ALF/memory care) Prior Approval Number:    Date Approved/Denied:   PASRR Number:    Discharge Plan: Other (Comment) (ALF/memory care)    Current Diagnoses: Patient Active Problem List   Diagnosis Date Noted  . Hypomagnesemia 10/13/2019  . Hypocalcemia 10/13/2019  . Recurrent syncope 10/09/2019  . AKI (acute kidney injury) (HCC) 10/09/2019  . History of completed stroke 10/09/2019  . Dementia (HCC) 10/09/2019  . Elevated troponin 10/09/2019  . Hypokalemia 10/09/2019  . Bacteriuria 10/09/2019  . Hypertension   . Acute cystitis without hematuria   . Syncope   . Chronic cough 07/31/2010    Orientation RESPIRATION BLADDER Height & Weight     Self  Normal Incontinent, External catheter Weight: 66 kg Height:  5\' 4"  (162.6 cm)  BEHAVIORAL SYMPTOMS/MOOD NEUROLOGICAL BOWEL NUTRITION STATUS      Continent  (Soft)  AMBULATORY STATUS COMMUNICATION OF NEEDS Skin   Total Care Verbally Other (Comment)                       Personal Care Assistance Level of Assistance  Bathing, Feeding, Dressing Bathing Assistance: Maximum assistance Feeding assistance: Maximum assistance Dressing Assistance: Maximum assistance     Functional Limitations Info  Sight, Hearing, Speech Sight Info: Impaired Hearing Info: Impaired Speech Info: Impaired    SPECIAL CARE FACTORS FREQUENCY  PT (By licensed PT), OT (By licensed OT)      PT Frequency: 5x week OT Frequency: 5x week            Contractures Contractures Info: Not present    Additional Factors Info  Code Status, Allergies Code Status Info: DNR Allergies Info: Ace inhibitors           Current Medications (10/15/2019):  This is the current hospital active medication list Current Facility-Administered Medications  Medication Dose Route Frequency Provider Last Rate Last Admin  . acetaminophen (TYLENOL) tablet 650 mg  650 mg Oral Q6H PRN 12/15/2019, MD       Or  . acetaminophen (TYLENOL) suppository 650 mg  650 mg Rectal Q6H PRN Charlsie Quest R, MD      . atorvastatin (LIPITOR) tablet 40 mg  40 mg Oral Daily Darreld Mclean, MD   40 mg at 10/15/19 0937  . dipyridamole-aspirin (AGGRENOX) 200-25 MG per 12 hr capsule 1 capsule  1 capsule Oral BID 12/15/19, MD   1 capsule at 10/15/19 0937  . enoxaparin (LOVENOX) injection 40 mg  40 mg Subcutaneous Q24H 12/15/19, MD   40 mg at 10/14/19 2103  . metoprolol tartrate (LOPRESSOR) tablet 25 mg  25 mg Oral BID 2104, MD   25 mg at 10/15/19 0938  . pantoprazole (PROTONIX) EC tablet 40 mg  40 mg Oral Daily 12/15/19, MD   40 mg at 10/15/19 0938  . phosphorus (K PHOS NEUTRAL) tablet 500 mg  500 mg Oral TID 12/15/19, MD  500 mg at 10/15/19 0938  . sodium chloride flush (NS) 0.9 % injection 3 mL  3 mL Intravenous Q12H Lenore Cordia, MD   3 mL at 10/15/19 3748     Discharge Medications: Please see discharge summary for a list of discharge medications.  Relevant Imaging Results:  Relevant Lab Results:   Additional Information ss#[id number]  [name], RN

## 2019-10-15 NOTE — TOC Transition Note (Signed)
Transition of Care [facility]) - CM/SW Discharge Note   Patient Details  Name: Rozalyn Osland MRN: [id number] Date of Birth: 1923-02-21  Transition of Care [facility]) CM/SW Contact:  Lanier Clam, RN Phone Number: 10/15/2019, 9:54 AM   Clinical Narrative: Awaiting response from [facility] rep Melissa called [phone] if they can take patient back will need rm#, & tel# for report.PTAR for safe d/c back.      Final next level of care: Memory Care Barriers to Discharge: Other (comment) (awaiting response from [facility]-ALF/memory care-for d/c.)   Patient Goals and CMS Choice Patient states their goals for this hospitalization and ongoing recovery are:: return back to ALF memory care CMS Medicare.gov Compare Post Acute Care list provided to:: Patient Represenative (must comment) (dtr) Choice offered to / list presented to : Adult Children  Discharge Placement                       Discharge Plan and Services   Discharge Planning Services: CM Consult                                 Social Determinants of Health (SDOH) Interventions     Readmission Risk Interventions No flowsheet data found.

## 2019-10-15 NOTE — Discharge Summary (Signed)
Physician Discharge Summary  Amanda Owen PQZ:[id number] DOB: [birth year] DOA: 10/13/2019  PCP: Charlesetta Shanks, MD  Admit date: 10/13/2019 Discharge date: 10/15/2019  Admitted From: SNF Disposition:  SNF  Discharge Condition:Stable CODE STATUS:DNR, Comfort Care Diet recommendation: Soft diet  Brief/Interim Summary:   Syncope: Recent admission for the same.  Currently hemodynamically stable. She was treated with gentle IV fluids.  She was on Celexa, Namenda, trazodone and Lopressor at her baseline.  Continue fall precaution.   She has high risk of falling and syncope in the future likely this is from autonomic dysfunction and unfortunately we may not be able to fix that. She is hypertensive so not a candidate for midodrine.  Will recommend to use TED hose.   She needs to be supervised 24 hours when she walks or sits.  Prolonged QTc: QTC of 546 which improved to 480s today.    Hypomagnesemia/hypokalemia/hypocalcemia/ hypophosphatemia: Supplemented .  Continue phosphorus supplementation for few doses.  Check phosphorus level in a week.  Hypertension: Hypertensive today.  Continue metoprolol and amlodipine.  Hyperlipidemia: Continue Lipitor  History of CVA: On Aggrenox and Lipitor.  Dementia with behavioral disturbance: Continue supportive care.  Takes Namenda at home.  Goals of care was discussed on her previous admission regarding her advanced age and multiple comorbidities.  She is DNR.We recommend to follow-up with palliative care as an outpatient.  Discharge Diagnoses:  Principal Problem:   Syncope Active Problems:   Dementia (HCC)   Hypokalemia   Hypertension   Hypomagnesemia   Hypocalcemia    Discharge Instructions  Discharge Instructions    Diet - low sodium heart healthy   Complete by: As directed    Discharge instructions   Complete by: As directed    1)Please take prescribed medications as instructed. 2)Do a CBC,BMP and phosphorous check in a week.    Increase activity slowly   Complete by: As directed    No wound care   Complete by: As directed      Allergies as of 10/15/2019      Reactions   Ace Inhibitors Other (See Comments)   unknown      Medication List    STOP taking these medications   aspirin 81 MG chewable tablet     TAKE these medications   ALPRAZolam 0.25 MG tablet Commonly known as: XANAX Take 0.25 mg by mouth every 8 (eight) hours as needed for anxiety.   amLODipine 5 MG tablet Commonly known as: NORVASC Take 1 tablet (5 mg total) by mouth daily.   atorvastatin 40 MG tablet Commonly known as: LIPITOR Take 40 mg by mouth daily.   citalopram 20 MG tablet Commonly known as: CELEXA Take 20 mg by mouth daily.   dipyridamole-aspirin 200-25 MG 12hr capsule Commonly known as: AGGRENOX Take 1 capsule by mouth 2 (two) times daily.   metoprolol tartrate 25 MG tablet Commonly known as: LOPRESSOR Take 25 mg by mouth 2 (two) times daily.   Namenda XR 28 MG Cp24 24 hr capsule Generic drug: memantine Take 28 mg by mouth daily.   omeprazole 20 MG capsule Commonly known as: PRILOSEC Take 20 mg by mouth daily.   phosphorus 155-852-130 MG tablet Commonly known as: K PHOS NEUTRAL Take 2 tablets (500 mg total) by mouth 3 (three) times daily.   traZODone 50 MG tablet Commonly known as: DESYREL Take 25 mg by mouth 2 (two) times daily as needed for sleep.       Contact information for after-discharge care  Destination    [facility] .   Service: Assisted Living Contact information: [address] [place] [address] [phone]                 Allergies  Allergen Reactions  . Ace Inhibitors Other (See Comments)    unknown    Consultations:  None   Procedures/Studies: DG Chest 1 View  Result Date: 10/09/2019 CLINICAL DATA:  Syncope EXAM: CHEST  1 VIEW COMPARISON:  08/29/2019 FINDINGS: Lungs are clear.  No pleural effusion or pneumothorax. The heart is normal  in size. IMPRESSION: No evidence of acute cardiopulmonary disease. Electronically Signed   By: Charline Bills M.D.   On: 10/09/2019 02:15   CT Head Wo Contrast  Result Date: 10/09/2019 CLINICAL DATA:  Encephalopathy. EXAM: CT HEAD WITHOUT CONTRAST TECHNIQUE: Contiguous axial images were obtained from the base of the skull through the vertex without intravenous contrast. COMPARISON:  Head CT 08/29/2019 [facility] FINDINGS: Brain: No intracranial hemorrhage, mass effect, or midline shift. Stable degree of atrophy and chronic small vessel ischemia. No hydrocephalus. The basilar cisterns are patent. Bilateral basal gangliar mineralization. No evidence of territorial infarct or acute ischemia. Chronic linear calcification in the right frontal lobe. No extra-axial or intracranial fluid collection. Vascular: Atherosclerosis of skullbase vasculature without hyperdense vessel or abnormal calcification. Skull: No fracture or focal lesion. Sinuses/Orbits: Dysconjugate gaze, typically incidental. Left cataract resection. Paranasal sinuses and mastoid air cells are clear. The visualized orbits are unremarkable. Other: None. IMPRESSION: 1. No acute intracranial abnormality. 2. Stable atrophy and chronic small vessel ischemia. Electronically Signed   By: Narda Rutherford M.D.   On: 10/09/2019 01:55   MR BRAIN WO CONTRAST  Result Date: 10/09/2019 CLINICAL DATA:  Encephalopathy. Additional history provided: Dementia, altered mental status, recent fall. EXAM: MRI HEAD WITHOUT CONTRAST TECHNIQUE: Multiplanar, multiecho pulse sequences of the brain and surrounding structures were obtained without intravenous contrast. COMPARISON:  Noncontrast head CT 10/09/2019 FINDINGS: Brain: The patient was unable to tolerate the examination. As a result, only axial and coronal diffusion-weighted imaging, a sagittal T1 weighted sequence, axial T2 weighted sequence and a coronal T2 weighted sequence could be obtained. The axial  diffusion-weighted sequence is of good quality. There is moderate motion degradation of the coronal diffusion-weighted sequence. The remainder of the examination is severely motion degraded and nondiagnostic. There is a punctate focus of apparent restricted diffusion within the paramedian posterior left frontal lobe which may reflect a tiny acute infarct or artifact (series 3, image 40). Vascular: Poorly assessed due to motion degradation. Skull and upper cervical spine: Poorly assessed due to motion degradation. Sinuses/Orbits: Poorly assessed due to motion degradation. IMPRESSION: Significantly motion degraded, limited and prematurely terminated examination as described. With the exception of the diffusion-weighted imaging, the examination is nondiagnostic. Punctate focus of apparent restricted diffusion within the paramedian posterior left frontal lobe, which may reflect a tiny acute infarct or artifact. Electronically Signed   By: Jackey Loge DO   On: 10/09/2019 16:21   EEG adult  Result Date: 10/09/2019 Charlsie Quest, MD     10/09/2019  4:23 PM Patient Name: Marnell Mcdaniel MRN: [id number] Epilepsy Attending: Charlsie Quest Referring Physician/Provider: Dr Marinda Elk Date: 10/09/2019 Duration: 26.06 mins Patient history: 86yo F with recurrent syncope. EEG to evaluate for seizure. Level of alertness: Awake AEDs during EEG study: None Technical aspects: This EEG study was done with scalp electrodes positioned according to the 10-20 International system of electrode placement. Electrical activity was  acquired at a sampling rate of 500Hz  and reviewed with a high frequency filter of 70Hz  and a low frequency filter of 1Hz . EEG data were recorded continuously and digitally stored. Description: The posterior dominant rhythm consists of 9 Hz activity of moderate voltage (25-35 uV) seen predominantly in posterior head regions, symmetric and reactive to eye opening and eye closing. Hyperventilation and photic  stimulation were not performed.   IMPRESSION: This study is within normal limits. No seizures or epileptiform discharges were seen throughout the recording. Priyanka Barbra Sarks       Subjective: Patient seen and examined at the bedside this morning.  Hemodynamically stable for discharge today.  Discharge Exam: Vitals:   10/14/19 2024 10/15/19 0437  BP: (!) 182/84 (!) 173/77  Pulse: 90 87  Resp: 18 18  Temp: 98.3 F (36.8 C) 98.3 F (36.8 C)  SpO2: 100% 98%   Vitals:   10/14/19 1004 10/14/19 1422 10/14/19 2024 10/15/19 0437  BP: (!) 171/67 (!) 149/72 (!) 182/84 (!) 173/77  Pulse: 86 85 90 87  Resp: 18 20 18 18   Temp: 98.4 F (36.9 C) 98.3 F (36.8 C) 98.3 F (36.8 C) 98.3 F (36.8 C)  TempSrc: Oral Oral Oral Oral  SpO2: 100% 100% 100% 98%  Weight:      Height:        General: Pt is alert, awake, not in acute distress Cardiovascular: RRR, S1/S2 +, no rubs, no gallops Respiratory: CTA bilaterally, no wheezing, no rhonchi Abdominal: Soft, NT, ND, bowel sounds + Extremities: no edema, no cyanosis    The results of significant diagnostics from this hospitalization (including imaging, microbiology, ancillary and laboratory) are listed below for reference.     Microbiology: Recent Results (from the past 240 hour(s))  Urine culture     Status: Abnormal   Collection Time: 10/09/19 12:13 AM   Specimen: Urine, Catheterized  Result Value Ref Range Status   Specimen Description URINE, CATHETERIZED  Final   Special Requests   Final    NONE Performed at [facility], [address] [place], [place] [address]    Culture >=100,000 COLONIES/mL ESCHERICHIA COLI (A)  Final   Report Status 10/11/2019 FINAL  Final   Organism ID, Bacteria ESCHERICHIA COLI (A)  Final      Susceptibility   Escherichia coli - MIC*    AMPICILLIN <=2 SENSITIVE Sensitive     CEFAZOLIN <=4 SENSITIVE Sensitive     CEFTRIAXONE <=1 SENSITIVE Sensitive     CIPROFLOXACIN <=0.25 SENSITIVE Sensitive      GENTAMICIN <=1 SENSITIVE Sensitive     IMIPENEM <=0.25 SENSITIVE Sensitive     NITROFURANTOIN <=16 SENSITIVE Sensitive     TRIMETH/SULFA <=20 SENSITIVE Sensitive     AMPICILLIN/SULBACTAM <=2 SENSITIVE Sensitive     PIP/TAZO <=4 SENSITIVE Sensitive     * >=100,000 COLONIES/mL ESCHERICHIA COLI  SARS Coronavirus 2 by RT PCR (hospital order, performed in [facility] hospital lab) Nasopharyngeal Nasopharyngeal Swab     Status: None   Collection Time: 10/09/19  7:49 AM   Specimen: Nasopharyngeal Swab  Result Value Ref Range Status   SARS Coronavirus 2 NEGATIVE NEGATIVE Final    Comment: (NOTE) SARS-CoV-2 target nucleic acids are NOT DETECTED. The SARS-CoV-2 RNA is generally detectable in upper and lower respiratory specimens during the acute phase of infection. The lowest concentration of SARS-CoV-2 viral copies this assay can detect is 250 copies / mL. A negative result does not preclude SARS-CoV-2 infection and should not be used  as the sole basis for treatment or other patient management decisions.  A negative result may occur with improper specimen collection / handling, submission of specimen other than nasopharyngeal swab, presence of viral mutation(s) within the areas targeted by this assay, and inadequate number of viral copies (<250 copies / mL). A negative result must be combined with clinical observations, patient history, and epidemiological information. Fact Sheet for Patients:   BoilerBrush.com.cy Fact Sheet for Healthcare Providers: https://pope.com/ This test is not yet approved or cleared  by the [place] FDA and has been authorized for detection and/or diagnosis of SARS-CoV-2 by FDA under an Emergency Use Authorization (EUA).  This EUA will remain in effect (meaning this test can be used) for the duration of the COVID-19 declaration under Section 564(b)(1) of the Act, 21 U.S.C. section 360bbb-3(b)(1), unless the  authorization is terminated or revoked sooner. Performed at [facility], [address], [place], [place] [address]   SARS Coronavirus 2 by RT PCR (hospital order, performed in [facility] hospital lab) Nasopharyngeal Nasopharyngeal Swab     Status: None   Collection Time: 10/13/19  6:15 PM   Specimen: Nasopharyngeal Swab  Result Value Ref Range Status   SARS Coronavirus 2 NEGATIVE NEGATIVE Final    Comment: (NOTE) SARS-CoV-2 target nucleic acids are NOT DETECTED. The SARS-CoV-2 RNA is generally detectable in upper and lower respiratory specimens during the acute phase of infection. The lowest concentration of SARS-CoV-2 viral copies this assay can detect is 250 copies / mL. A negative result does not preclude SARS-CoV-2 infection and should not be used as the sole basis for treatment or other patient management decisions.  A negative result may occur with improper specimen collection / handling, submission of specimen other than nasopharyngeal swab, presence of viral mutation(s) within the areas targeted by this assay, and inadequate number of viral copies (<250 copies / mL). A negative result must be combined with clinical observations, patient history, and epidemiological information. Fact Sheet for Patients:   BoilerBrush.com.cy Fact Sheet for Healthcare Providers: https://pope.com/ This test is not yet approved or cleared  by the [place] FDA and has been authorized for detection and/or diagnosis of SARS-CoV-2 by FDA under an Emergency Use Authorization (EUA).  This EUA will remain in effect (meaning this test can be used) for the duration of the COVID-19 declaration under Section 564(b)(1) of the Act, 21 U.S.C. section 360bbb-3(b)(1), unless the authorization is terminated or revoked sooner. Performed at [facility], [address] [place], [place] [address]   MRSA PCR Screening     Status: None    Collection Time: 10/14/19  5:30 AM   Specimen: Nasal Mucosa; Nasopharyngeal  Result Value Ref Range Status   MRSA by PCR NEGATIVE NEGATIVE Final    Comment:        The GeneXpert MRSA Assay (FDA approved for NASAL specimens only), is one component of a comprehensive MRSA colonization surveillance program. It is not intended to diagnose MRSA infection nor to guide or monitor treatment for MRSA infections. Performed at [facility], [address] [place], [place] [address]      Labs: BNP (last 3 results) No results for input(s): BNP in the last 8760 hours. Basic Metabolic Panel: Recent Labs  Lab 10/09/19 0446 10/09/19 0446 10/10/19 0255 10/11/19 0354 10/13/19 1552 10/13/19 1920 10/14/19 0549 10/15/19 0516  NA 138  --  143 139 144  --  140  --   K 3.4*  --  3.7 4.2 2.9*  --  5.0  --   CL 104  --  108 104 119*  --  111  --   CO2 23  --  24 24 17*  --  22  --   GLUCOSE 98  --  94 93 75  --  91  --   BUN 26*  --  18 14 20   --  22  --   CREATININE 1.41*  --  1.17* 1.16* 0.67  --  0.82  --   CALCIUM 9.0   < > 8.7* 8.7* 5.8* 8.7* 8.4*  --   MG  --   --   --   --  1.5*  --  2.5*  --   PHOS  --   --   --   --   --   --  1.6* 2.0*   < > = values in this interval not displayed.   Liver Function Tests: Recent Labs  Lab 10/09/19 0446 10/13/19 1552  AST 23 24  ALT 16 14  ALKPHOS 81 52  BILITOT 0.6 0.7  PROT 6.2* 4.5*  ALBUMIN 2.9* 2.0*   No results for input(s): LIPASE, AMYLASE in the last 168 hours. No results for input(s): AMMONIA in the last 168 hours. CBC: Recent Labs  Lab 10/09/19 0434 10/09/19 0953 10/10/19 0255 10/11/19 0354 10/13/19 1552 10/14/19 0549  WBC 8.3  --  8.3 9.5 7.3 7.5  NEUTROABS 5.9  --   --   --  5.6  --   HGB 13.3  --  11.8* 13.0 12.5 11.1*  HCT 42.9 37.5 37.4 41.6 41.0 34.9*  MCV 96.0  --  93.5 93.9 97.2 94.1  PLT 244  --  254 275 229 224   Cardiac Enzymes: No results for input(s): CKTOTAL, CKMB, CKMBINDEX,  TROPONINI in the last 168 hours. BNP: Invalid input(s): POCBNP CBG: Recent Labs  Lab 10/10/19 0801 10/10/19 1113 10/10/19 1615 10/10/19 2149 10/11/19 0739  GLUCAP 90 76 82 76 81   D-Dimer No results for input(s): DDIMER in the last 72 hours. Hgb A1c No results for input(s): HGBA1C in the last 72 hours. Lipid Profile No results for input(s): CHOL, HDL, LDLCALC, TRIG, CHOLHDL, LDLDIRECT in the last 72 hours. Thyroid function studies No results for input(s): TSH, T4TOTAL, T3FREE, THYROIDAB in the last 72 hours.  Invalid input(s): FREET3 Anemia work up No results for input(s): VITAMINB12, FOLATE, FERRITIN, TIBC, IRON, RETICCTPCT in the last 72 hours. Urinalysis    Component Value Date/Time   COLORURINE YELLOW 10/13/2019 1704   APPEARANCEUR HAZY (A) 10/13/2019 1704   LABSPEC 1.017 10/13/2019 1704   PHURINE 6.0 10/13/2019 1704   GLUCOSEU NEGATIVE 10/13/2019 1704   HGBUR SMALL (A) 10/13/2019 1704   BILIRUBINUR NEGATIVE 10/13/2019 1704   KETONESUR 5 (A) 10/13/2019 1704   PROTEINUR NEGATIVE 10/13/2019 1704   UROBILINOGEN 1.0 09/19/2010 1709   NITRITE NEGATIVE 10/13/2019 1704   LEUKOCYTESUR LARGE (A) 10/13/2019 1704   Sepsis Labs Invalid input(s): PROCALCITONIN,  WBC,  LACTICIDVEN Microbiology Recent Results (from the past 240 hour(s))  Urine culture     Status: Abnormal   Collection Time: 10/09/19 12:13 AM   Specimen: Urine, Catheterized  Result Value Ref Range Status   Specimen Description URINE, CATHETERIZED  Final   Special Requests   Final    NONE Performed at [facility], [address] [place], [place]    Culture >=100,000 COLONIES/mL ESCHERICHIA COLI (A)  Final   Report  Status 10/11/2019 FINAL  Final   Organism ID, Bacteria ESCHERICHIA COLI (A)  Final      Susceptibility   Escherichia coli - MIC*    AMPICILLIN <=2 SENSITIVE Sensitive     CEFAZOLIN <=4 SENSITIVE Sensitive     CEFTRIAXONE <=1 SENSITIVE Sensitive     CIPROFLOXACIN <=0.25  SENSITIVE Sensitive     GENTAMICIN <=1 SENSITIVE Sensitive     IMIPENEM <=0.25 SENSITIVE Sensitive     NITROFURANTOIN <=16 SENSITIVE Sensitive     TRIMETH/SULFA <=20 SENSITIVE Sensitive     AMPICILLIN/SULBACTAM <=2 SENSITIVE Sensitive     PIP/TAZO <=4 SENSITIVE Sensitive     * >=100,000 COLONIES/mL ESCHERICHIA COLI  SARS Coronavirus 2 by RT PCR (hospital order, performed in [facility] hospital lab) Nasopharyngeal Nasopharyngeal Swab     Status: None   Collection Time: 10/09/19  7:49 AM   Specimen: Nasopharyngeal Swab  Result Value Ref Range Status   SARS Coronavirus 2 NEGATIVE NEGATIVE Final    Comment: (NOTE) SARS-CoV-2 target nucleic acids are NOT DETECTED. The SARS-CoV-2 RNA is generally detectable in upper and lower respiratory specimens during the acute phase of infection. The lowest concentration of SARS-CoV-2 viral copies this assay can detect is 250 copies / mL. A negative result does not preclude SARS-CoV-2 infection and should not be used as the sole basis for treatment or other patient management decisions.  A negative result may occur with improper specimen collection / handling, submission of specimen other than nasopharyngeal swab, presence of viral mutation(s) within the areas targeted by this assay, and inadequate number of viral copies (<250 copies / mL). A negative result must be combined with clinical observations, patient history, and epidemiological information. Fact Sheet for Patients:   BoilerBrush.com.cy Fact Sheet for Healthcare Providers: https://pope.com/ This test is not yet approved or cleared  by the [place] FDA and has been authorized for detection and/or diagnosis of SARS-CoV-2 by FDA under an Emergency Use Authorization (EUA).  This EUA will remain in effect (meaning this test can be used) for the duration of the COVID-19 declaration under Section 564(b)(1) of the Act, 21 U.S.C. section  360bbb-3(b)(1), unless the authorization is terminated or revoked sooner. Performed at [facility], [address] [place], [place] [address]   SARS Coronavirus 2 by RT PCR (hospital order, performed in [facility] hospital lab) Nasopharyngeal Nasopharyngeal Swab     Status: None   Collection Time: 10/13/19  6:15 PM   Specimen: Nasopharyngeal Swab  Result Value Ref Range Status   SARS Coronavirus 2 NEGATIVE NEGATIVE Final    Comment: (NOTE) SARS-CoV-2 target nucleic acids are NOT DETECTED. The SARS-CoV-2 RNA is generally detectable in upper and lower respiratory specimens during the acute phase of infection. The lowest concentration of SARS-CoV-2 viral copies this assay can detect is 250 copies / mL. A negative result does not preclude SARS-CoV-2 infection and should not be used as the sole basis for treatment or other patient management decisions.  A negative result may occur with improper specimen collection / handling, submission of specimen other than nasopharyngeal swab, presence of viral mutation(s) within the areas targeted by this assay, and inadequate number of viral copies (<250 copies / mL). A negative result must be combined with clinical observations, patient history, and epidemiological information. Fact Sheet for Patients:   BoilerBrush.com.cy Fact Sheet for Healthcare Providers: https://pope.com/ This test is not yet approved or cleared  by the [place] FDA and has been authorized for detection and/or diagnosis of  SARS-CoV-2 by FDA under an Emergency Use Authorization (EUA).  This EUA will remain in effect (meaning this test can be used) for the duration of the COVID-19 declaration under Section 564(b)(1) of the Act, 21 U.S.C. section 360bbb-3(b)(1), unless the authorization is terminated or revoked sooner. Performed at [facility], [address] [place], [place] [address]   MRSA PCR  Screening     Status: None   Collection Time: 10/14/19  5:30 AM   Specimen: Nasal Mucosa; Nasopharyngeal  Result Value Ref Range Status   MRSA by PCR NEGATIVE NEGATIVE Final    Comment:        The GeneXpert MRSA Assay (FDA approved for NASAL specimens only), is one component of a comprehensive MRSA colonization surveillance program. It is not intended to diagnose MRSA infection nor to guide or monitor treatment for MRSA infections. Performed at [facility], [address] [place], [place] [address]     Please note: You were cared for by a hospitalist during your hospital stay. Once you are discharged, your primary care physician will handle any further medical issues. Please note that NO REFILLS for any discharge medications will be authorized once you are discharged, as it is imperative that you return to your primary care physician (or establish a relationship with a primary care physician if you do not have one) for your post hospital discharge needs so that they can reassess your need for medications and monitor your lab values.    Time coordinating discharge: 40 minutes  SIGNED:   Burnadette PopAmrit Erendira Crabtree, MD  [facility] 10/15/2019, 11:16 AM Pager [phone]  If 7PM-7AM, please contact night-coverage [email or web address] Password [email or web address]

## 2019-10-16 ENCOUNTER — Emergency Department (HOSPITAL_COMMUNITY): Payer: Medicare Other

## 2019-10-16 ENCOUNTER — Encounter (HOSPITAL_COMMUNITY): Payer: Self-pay | Admitting: Internal Medicine

## 2019-10-16 ENCOUNTER — Other Ambulatory Visit: Payer: Self-pay

## 2019-10-16 ENCOUNTER — Inpatient Hospital Stay (HOSPITAL_COMMUNITY)
Admission: EM | Admit: 2019-10-16 | Discharge: 2019-10-21 | DRG: 315 | Disposition: A | Discharge status: Skilled Nursing Facility | Payer: Medicare Other | Source: Skilled Nursing Facility | Attending: Internal Medicine | Admitting: Internal Medicine

## 2019-10-16 ENCOUNTER — Inpatient Hospital Stay (HOSPITAL_COMMUNITY): Payer: Medicare Other

## 2019-10-16 DIAGNOSIS — Z90710 Acquired absence of both cervix and uterus: Secondary | ICD-10-CM

## 2019-10-16 DIAGNOSIS — R55 Syncope and collapse: Secondary | ICD-10-CM | POA: Diagnosis present

## 2019-10-16 DIAGNOSIS — Z888 Allergy status to other drugs, medicaments and biological substances status: Secondary | ICD-10-CM | POA: Diagnosis not present

## 2019-10-16 DIAGNOSIS — K219 Gastro-esophageal reflux disease without esophagitis: Secondary | ICD-10-CM | POA: Diagnosis present

## 2019-10-16 DIAGNOSIS — Z8249 Family history of ischemic heart disease and other diseases of the circulatory system: Secondary | ICD-10-CM

## 2019-10-16 DIAGNOSIS — Z7189 Other specified counseling: Secondary | ICD-10-CM

## 2019-10-16 DIAGNOSIS — I959 Hypotension, unspecified: Secondary | ICD-10-CM | POA: Diagnosis present

## 2019-10-16 DIAGNOSIS — I129 Hypertensive chronic kidney disease with stage 1 through stage 4 chronic kidney disease, or unspecified chronic kidney disease: Secondary | ICD-10-CM | POA: Diagnosis present

## 2019-10-16 DIAGNOSIS — R627 Adult failure to thrive: Secondary | ICD-10-CM | POA: Diagnosis present

## 2019-10-16 DIAGNOSIS — G9340 Encephalopathy, unspecified: Secondary | ICD-10-CM | POA: Diagnosis present

## 2019-10-16 DIAGNOSIS — N390 Urinary tract infection, site not specified: Secondary | ICD-10-CM | POA: Diagnosis present

## 2019-10-16 DIAGNOSIS — Z20822 Contact with and (suspected) exposure to covid-19: Secondary | ICD-10-CM | POA: Diagnosis present

## 2019-10-16 DIAGNOSIS — R54 Age-related physical debility: Secondary | ICD-10-CM | POA: Diagnosis present

## 2019-10-16 DIAGNOSIS — F329 Major depressive disorder, single episode, unspecified: Secondary | ICD-10-CM | POA: Diagnosis present

## 2019-10-16 DIAGNOSIS — F0390 Unspecified dementia without behavioral disturbance: Secondary | ICD-10-CM | POA: Diagnosis present

## 2019-10-16 DIAGNOSIS — Z6825 Body mass index (BMI) 25.0-25.9, adult: Secondary | ICD-10-CM | POA: Diagnosis not present

## 2019-10-16 DIAGNOSIS — Z66 Do not resuscitate: Secondary | ICD-10-CM | POA: Diagnosis present

## 2019-10-16 DIAGNOSIS — R9431 Abnormal electrocardiogram [ECG] [EKG]: Secondary | ICD-10-CM | POA: Diagnosis present

## 2019-10-16 DIAGNOSIS — R29721 NIHSS score 21: Secondary | ICD-10-CM | POA: Diagnosis present

## 2019-10-16 DIAGNOSIS — Z8673 Personal history of transient ischemic attack (TIA), and cerebral infarction without residual deficits: Secondary | ICD-10-CM | POA: Diagnosis not present

## 2019-10-16 DIAGNOSIS — Z79899 Other long term (current) drug therapy: Secondary | ICD-10-CM | POA: Diagnosis not present

## 2019-10-16 DIAGNOSIS — R0681 Apnea, not elsewhere classified: Secondary | ICD-10-CM | POA: Diagnosis present

## 2019-10-16 DIAGNOSIS — L89626 Pressure-induced deep tissue damage of left heel: Secondary | ICD-10-CM | POA: Diagnosis present

## 2019-10-16 DIAGNOSIS — L89896 Pressure-induced deep tissue damage of other site: Secondary | ICD-10-CM | POA: Diagnosis present

## 2019-10-16 DIAGNOSIS — J189 Pneumonia, unspecified organism: Secondary | ICD-10-CM

## 2019-10-16 DIAGNOSIS — Z515 Encounter for palliative care: Secondary | ICD-10-CM

## 2019-10-16 DIAGNOSIS — N179 Acute kidney failure, unspecified: Secondary | ICD-10-CM | POA: Diagnosis present

## 2019-10-16 DIAGNOSIS — E785 Hyperlipidemia, unspecified: Secondary | ICD-10-CM | POA: Diagnosis present

## 2019-10-16 DIAGNOSIS — F0391 Unspecified dementia with behavioral disturbance: Secondary | ICD-10-CM | POA: Diagnosis not present

## 2019-10-16 DIAGNOSIS — N183 Chronic kidney disease, stage 3 unspecified: Secondary | ICD-10-CM | POA: Diagnosis present

## 2019-10-16 DIAGNOSIS — F32A Depression, unspecified: Secondary | ICD-10-CM | POA: Diagnosis present

## 2019-10-16 DIAGNOSIS — I10 Essential (primary) hypertension: Secondary | ICD-10-CM | POA: Diagnosis not present

## 2019-10-16 LAB — COMPREHENSIVE METABOLIC PANEL
ALT: 18 U/L (ref 0–44)
AST: 29 U/L (ref 15–41)
Albumin: 2.7 g/dL — ABNORMAL LOW (ref 3.5–5.0)
Alkaline Phosphatase: 75 U/L (ref 38–126)
Anion gap: 13 (ref 5–15)
BUN: 21 mg/dL (ref 8–23)
CO2: 19 mmol/L — ABNORMAL LOW (ref 22–32)
Calcium: 8.7 mg/dL — ABNORMAL LOW (ref 8.9–10.3)
Chloride: 107 mmol/L (ref 98–111)
Creatinine, Ser: 1.48 mg/dL — ABNORMAL HIGH (ref 0.44–1.00)
GFR calc Af Amer: 35 mL/min — ABNORMAL LOW (ref >60)
GFR calc non Af Amer: 30 mL/min — ABNORMAL LOW (ref >60)
Glucose, Bld: 110 mg/dL — ABNORMAL HIGH (ref 70–99)
Potassium: 4.5 mmol/L (ref 3.5–5.1)
Sodium: 139 mmol/L (ref 135–145)
Total Bilirubin: 1 mg/dL (ref 0.3–1.2)
Total Protein: 6.4 g/dL — ABNORMAL LOW (ref 6.5–8.1)

## 2019-10-16 LAB — CBC
HCT: 40.3 % (ref 36.0–46.0)
Hemoglobin: 12.7 g/dL (ref 12.0–15.0)
MCH: 29.7 pg (ref 26.0–34.0)
MCHC: 31.5 g/dL (ref 30.0–36.0)
MCV: 94.4 fL (ref 80.0–100.0)
Platelets: 228 10*3/uL (ref 150–400)
RBC: 4.27 MIL/uL (ref 3.87–5.11)
RDW: 16.3 % — ABNORMAL HIGH (ref 11.5–15.5)
WBC: 8.7 10*3/uL (ref 4.0–10.5)
nRBC: 0 % (ref 0.0–0.2)

## 2019-10-16 LAB — I-STAT CHEM 8, ED
BUN: 20 mg/dL (ref 8–23)
Calcium, Ion: 1.18 mmol/L (ref 1.15–1.40)
Chloride: 108 mmol/L (ref 98–111)
Creatinine, Ser: 1.4 mg/dL — ABNORMAL HIGH (ref 0.44–1.00)
Glucose, Bld: 104 mg/dL — ABNORMAL HIGH (ref 70–99)
HCT: 39 % (ref 36.0–46.0)
Hemoglobin: 13.3 g/dL (ref 12.0–15.0)
Potassium: 4.3 mmol/L (ref 3.5–5.1)
Sodium: 140 mmol/L (ref 135–145)
TCO2: 22 mmol/L (ref 22–32)

## 2019-10-16 LAB — URINALYSIS, ROUTINE W REFLEX MICROSCOPIC
Bilirubin Urine: NEGATIVE
Glucose, UA: NEGATIVE mg/dL
Ketones, ur: NEGATIVE mg/dL
Nitrite: NEGATIVE
Protein, ur: 30 mg/dL — AB
Specific Gravity, Urine: 1.014 (ref 1.005–1.030)
WBC, UA: >50 WBC/hpf — ABNORMAL HIGH (ref 0–5)
pH: 6 (ref 5.0–8.0)

## 2019-10-16 LAB — DIFFERENTIAL
Abs Immature Granulocytes: 0.11 10*3/uL — ABNORMAL HIGH (ref 0.00–0.07)
Basophils Absolute: 0 10*3/uL (ref 0.0–0.1)
Basophils Relative: 1 %
Eosinophils Absolute: 0.1 10*3/uL (ref 0.0–0.5)
Eosinophils Relative: 1 %
Immature Granulocytes: 1 %
Lymphocytes Relative: 16 %
Lymphs Abs: 1.4 10*3/uL (ref 0.7–4.0)
Monocytes Absolute: 0.6 10*3/uL (ref 0.1–1.0)
Monocytes Relative: 7 %
Neutro Abs: 6.5 10*3/uL (ref 1.7–7.7)
Neutrophils Relative %: 74 %

## 2019-10-16 LAB — PHOSPHORUS: Phosphorus: 3.4 mg/dL (ref 2.5–4.6)

## 2019-10-16 LAB — SARS CORONAVIRUS 2 BY RT PCR (HOSPITAL ORDER, PERFORMED IN ~~LOC~~ HOSPITAL LAB): SARS Coronavirus 2: NEGATIVE

## 2019-10-16 LAB — CBG MONITORING, ED: Glucose-Capillary: 80 mg/dL (ref 70–99)

## 2019-10-16 LAB — MAGNESIUM: Magnesium: 2.1 mg/dL (ref 1.7–2.4)

## 2019-10-16 MED ORDER — ASPIRIN-DIPYRIDAMOLE ER 25-200 MG PO CP12
1.0000 | ORAL_CAPSULE | Freq: Two times a day (BID) | ORAL | Status: DC
  Administered 2019-10-17 – 2019-10-21 (×8): 1 via ORAL
  Filled 2019-10-16 (×12): qty 1

## 2019-10-16 MED ORDER — SODIUM CHLORIDE 0.9% FLUSH
3.0000 mL | Freq: Once | INTRAVENOUS | Status: DC
Start: 2019-10-16 — End: 2019-10-21

## 2019-10-16 MED ORDER — AMLODIPINE BESYLATE 5 MG PO TABS
5.0000 mg | ORAL_TABLET | Freq: Every day | ORAL | Status: DC
  Administered 2019-10-17 – 2019-10-21 (×4): 5 mg via ORAL
  Filled 2019-10-16 (×5): qty 1

## 2019-10-16 MED ORDER — MEMANTINE HCL ER 28 MG PO CP24
28.0000 mg | ORAL_CAPSULE | Freq: Every day | ORAL | Status: DC
  Filled 2019-10-16: qty 1

## 2019-10-16 MED ORDER — ACETAMINOPHEN 650 MG RE SUPP: 650.0000 mg | Freq: Four times a day (QID) | RECTAL | Status: DC | PRN

## 2019-10-16 MED ORDER — ONDANSETRON HCL 4 MG/2ML IJ SOLN: 4.0000 mg | Freq: Four times a day (QID) | INTRAMUSCULAR | Status: DC | PRN

## 2019-10-16 MED ORDER — LACTATED RINGERS IV SOLN: INTRAVENOUS | Status: AC

## 2019-10-16 MED ORDER — PANTOPRAZOLE SODIUM 40 MG PO TBEC
40.0000 mg | DELAYED_RELEASE_TABLET | Freq: Every day | ORAL | Status: DC
  Administered 2019-10-17 – 2019-10-21 (×4): 40 mg via ORAL
  Filled 2019-10-16 (×5): qty 1

## 2019-10-16 MED ORDER — ACETAMINOPHEN 325 MG PO TABS: 650.0000 mg | ORAL_TABLET | Freq: Four times a day (QID) | ORAL | Status: DC | PRN

## 2019-10-16 MED ORDER — CITALOPRAM HYDROBROMIDE 10 MG PO TABS: 20.0000 mg | ORAL_TABLET | Freq: Every day | ORAL | Status: DC

## 2019-10-16 MED ORDER — SODIUM CHLORIDE 0.9 % IV SOLN: INTRAVENOUS | Status: DC

## 2019-10-16 MED ORDER — ATORVASTATIN CALCIUM 40 MG PO TABS
40.0000 mg | ORAL_TABLET | Freq: Every day | ORAL | Status: DC
  Administered 2019-10-17 – 2019-10-21 (×4): 40 mg via ORAL
  Filled 2019-10-16 (×4): qty 1

## 2019-10-16 MED ORDER — ENOXAPARIN SODIUM 30 MG/0.3ML ~~LOC~~ SOLN
30.0000 mg | SUBCUTANEOUS | Status: DC
  Administered 2019-10-17: 30 mg via SUBCUTANEOUS
  Filled 2019-10-16: qty 0.3

## 2019-10-16 MED ORDER — ONDANSETRON HCL 4 MG PO TABS: 4.0000 mg | ORAL_TABLET | Freq: Four times a day (QID) | ORAL | Status: DC | PRN

## 2019-10-16 NOTE — H&P (Addendum)
History and Physical    Amanda Owen [id number] DOB: 1923/03/11 DOA: 10/16/2019  PCP: De Nurse, MD  Patient coming from: [place] facility  I have personally briefly reviewed patient's old medical records in [place]  Chief Complaint: Unresponsive/syncope  HPI: Amanda Owen is a 86 y.o. female with medical history significant of hypertension, hyperlipidemia, dementia, depression, CVA,  recurrent syncope brought by EMS to emergency department for the concern syncopal episode.  Per EMS: Patient was LKW at 0745 this a.m when she was up in her wheelchair eating breakfast  Alert and talking.staff went and check on her & she was found slumped over in the wheelchair therefore EMS was called.  Upon EMS arrival patient was cold to touch, unresponsive with period Of apnea.  Patient was being bagged by EMS and brought patient to ER for further evaluation and management.  Code stroke was called and CT head was obtained which came back negative for stroke.  Code stroke was canceled.  Patient was evaluated by neurology in ED.  No history of seizure, head trauma, fall, vomiting, fever, urinary or bowel incontinence.  Patient recently admitted at [facility] with syncopal episodes QT interval and electrolyte imbalance.  She discharged recently on 6/9.  Upon my evaluation evaluation: Patient alert, following commands however poor historian. She has wet cough during encounter.  She denies any pain.  ED Course: Upon arrival to ED: Patient vital signs stable, initially she placed on nonrebreather and then RA.  She is afebrile, no leukocytosis.  CMP shows AKI.  CT head negative for acute findings.  COVID-19 pending.  [facility] hospitalist consulted for admission for syncope.  Review of Systems: As per HPI otherwise negative.    Past Medical History:  Diagnosis Date  . Chronic kidney failure   . Depression   . Hyperlipidemia   . Hypertension   . Memory deficit   . Occlusion of  carotid artery   . Vitamin deficiency     Past Surgical History:  Procedure Laterality Date  . ABDOMINAL HYSTERECTOMY       reports that she has never smoked. She has never used smokeless tobacco. She reports that she does not drink alcohol. No history on file for drug use.  Allergies  Allergen Reactions  . Ace Inhibitors Other (See Comments)    unknown    Family History  Problem Relation Age of Onset  . Heart disease Mother   . Heart disease Sister   . Heart disease Brother     Prior to Admission medications   Medication Sig Start Date End Date Taking? Authorizing Provider  amLODipine (NORVASC) 5 MG tablet Take 1 tablet (5 mg total) by mouth daily. 10/11/19 10/10/20 Yes Charlynne Cousins, MD  atorvastatin (LIPITOR) 40 MG tablet Take 40 mg by mouth daily. 08/27/19  Yes [provider]  citalopram (CELEXA) 20 MG tablet Take 20 mg by mouth daily.     Yes [provider]  dipyridamole-aspirin (AGGRENOX) 200-25 MG per 12 hr capsule Take 1 capsule by mouth 2 (two) times daily.   Yes [provider]  memantine (NAMENDA XR) 28 MG CP24 24 hr capsule Take 28 mg by mouth daily.   Yes [provider]  metoprolol tartrate (LOPRESSOR) 25 MG tablet Take 25 mg by mouth 2 (two) times daily.   Yes [provider]  omeprazole (PRILOSEC) 20 MG capsule Take 20 mg by mouth daily.   Yes [provider]  traZODone (DESYREL) 50 MG tablet Take  50 mg by mouth at bedtime.    Yes [provider]  phosphorus (K PHOS NEUTRAL) 155-852-130 MG tablet Take 2 tablets (500 mg total) by mouth 3 (three) times daily. 10/15/19   Burnadette Pop, MD    Physical Exam: Vitals:   10/16/19 1315 10/16/19 1316 10/16/19 1345 10/16/19 1400  BP: (!) 156/61  (!) 152/77 (!) 146/58  Pulse:  86 68 84  Resp: 16 18 (!) 23 16  Temp:      TempSrc:      SpO2:  100% 100% 100%    Constitutional: NAD, calm, comfortable alert and following commands.  On room air.   Eyes:  PERRL, lids and conjunctivae normal ENMT: Mucous membranes are dry posterior pharynx clear of any exudate or lesions.Normal dentition.  Neck: normal, supple, no masses, no thyromegaly Respiratory: clear to auscultation bilaterally, no wheezing, no crackles. Normal respiratory effort. No accessory muscle use.  Cardiovascular: Regular rate and rhythm, soft systolic murmur/ rubs / gallops. No extremity edema. 2+ pedal pulses. No carotid bruits.  Abdomen: no tenderness, no masses palpated. No hepatosplenomegaly. Bowel sounds positive.  Musculoskeletal: no clubbing / cyanosis. No joint deformity upper and lower extremities. Good ROM, no contractures. Normal muscle tone.  Skin: no rashes, lesions, ulcers. No induration Neurologic: Alert, following commands.  Not oriented to time place and person   Labs on Admission: I have personally reviewed following labs and imaging studies  CBC: Recent Labs  Lab 10/10/19 0255 10/10/19 0255 10/11/19 0354 10/13/19 1552 10/14/19 0549 10/16/19 1253 10/16/19 1255  WBC 8.3  --  9.5 7.3 7.5 8.7  --   NEUTROABS  --   --   --  5.6  --  6.5  --   HGB 11.8*   < > 13.0 12.5 11.1* 12.7 13.3  HCT 37.4   < > 41.6 41.0 34.9* 40.3 39.0  MCV 93.5  --  93.9 97.2 94.1 94.4  --   PLT 254  --  275 229 224 228  --    < > = values in this interval not displayed.   Basic Metabolic Panel: Recent Labs  Lab 10/10/19 0255 10/10/19 0255 10/11/19 0354 10/11/19 0354 10/13/19 1552 10/13/19 1920 10/14/19 0549 10/15/19 0516 10/16/19 1253 10/16/19 1255  NA 143   < > 139  --  144  --  140  --  139 140  K 3.7   < > 4.2  --  2.9*  --  5.0  --  4.5 4.3  CL 108   < > 104  --  119*  --  111  --  107 108  CO2 24  --  24  --  17*  --  22  --  19*  --   GLUCOSE 94   < > 93  --  75  --  91  --  110* 104*  BUN 18   < > 14  --  20  --  22  --  21 20  CREATININE 1.17*   < > 1.16*  --  0.67  --  0.82  --  1.48* 1.40*  CALCIUM 8.7*   < > 8.7*   < > 5.8* 8.7* 8.4*  8.1*  --  8.7*  --    MG  --   --   --   --  1.5*  --  2.5*  --   --   --   PHOS  --   --   --   --   --   --  1.6* 2.0*  --   --    < > = values in this interval not displayed.   GFR: Estimated Creatinine Clearance: 23 mL/min (A) (by C-G formula based on SCr of 1.4 mg/dL (H)). Liver Function Tests: Recent Labs  Lab 10/13/19 1552 10/16/19 1253  AST 24 29  ALT 14 18  ALKPHOS 52 75  BILITOT 0.7 1.0  PROT 4.5* 6.4*  ALBUMIN 2.0* 2.7*   No results for input(s): LIPASE, AMYLASE in the last 168 hours. No results for input(s): AMMONIA in the last 168 hours. Coagulation Profile: No results for input(s): INR, PROTIME in the last 168 hours. Cardiac Enzymes: No results for input(s): CKTOTAL, CKMB, CKMBINDEX, TROPONINI in the last 168 hours. BNP (last 3 results) No results for input(s): PROBNP in the last 8760 hours. HbA1C: No results for input(s): HGBA1C in the last 72 hours. CBG: Recent Labs  Lab 10/10/19 1113 10/10/19 1615 10/10/19 2149 10/11/19 0739 10/16/19 1113  GLUCAP 76 82 76 81 80   Lipid Profile: No results for input(s): CHOL, HDL, LDLCALC, TRIG, CHOLHDL, LDLDIRECT in the last 72 hours. Thyroid Function Tests: No results for input(s): TSH, T4TOTAL, FREET4, T3FREE, THYROIDAB in the last 72 hours. Anemia Panel: No results for input(s): VITAMINB12, FOLATE, FERRITIN, TIBC, IRON, RETICCTPCT in the last 72 hours. Urine analysis:    Component Value Date/Time   COLORURINE YELLOW 10/13/2019 1704   APPEARANCEUR HAZY (A) 10/13/2019 1704   LABSPEC 1.017 10/13/2019 1704   PHURINE 6.0 10/13/2019 1704   GLUCOSEU NEGATIVE 10/13/2019 1704   HGBUR SMALL (A) 10/13/2019 1704   BILIRUBINUR NEGATIVE 10/13/2019 1704   KETONESUR 5 (A) 10/13/2019 1704   PROTEINUR NEGATIVE 10/13/2019 1704   UROBILINOGEN 1.0 09/19/2010 1709   NITRITE NEGATIVE 10/13/2019 1704   LEUKOCYTESUR LARGE (A) 10/13/2019 1704    Radiological Exams on Admission: CT HEAD WO CONTRAST  Result Date: 10/16/2019 CLINICAL DATA:   Hypoxia. Unresponsive. Possible CVA. No reported injury. EXAM: CT HEAD WITHOUT CONTRAST TECHNIQUE: Contiguous axial images were obtained from the base of the skull through the vertex without intravenous contrast. COMPARISON:  10/09/2019 head CT. FINDINGS: Brain: No evidence of parenchymal hemorrhage or extra-axial fluid collection. No mass lesion, mass effect, or midline shift. No CT evidence of acute infarction. Generalized cerebral volume loss. Nonspecific moderate subcortical and periventricular white matter hypodensity, most in keeping with chronic small vessel ischemic change. Cerebral ventricle sizes are stable and concordant with the degree of cerebral volume loss. Vascular: No acute abnormality. Skull: No evidence of calvarial fracture. Osteitis frontalis interna. Sinuses/Orbits: The visualized paranasal sinuses are essentially clear. Other:  The mastoid air cells are unopacified. IMPRESSION: 1. No evidence of acute intracranial abnormality. 2. Generalized cerebral volume loss and moderate chronic small vessel ischemic changes in the cerebral white matter. Electronically Signed   By: Delbert Phenix M.D.   On: 10/16/2019 13:44    EKG: Independently reviewed.  A. fib, ventricular premature complexes, LVH, borderline prolonged QT interval.  No ST elevation or depression noted.  Assessment/Plan Principal Problem:   Recurrent syncope Active Problems:   AKI (acute kidney injury) (HCC)   Dementia (HCC)   HTN (hypertension)   Syncope   Hyperlipidemia   Depression   Syncope/unresponsiveness: -Patient has history of recurrent syncope episodes.  She admitted twice in last 1 week with similar symptoms.  Upon my evaluation she is alert and following commands and on room air. -Differential diagnosis: Syncope versus seizure versus polypharmacy -She is afebrile with no leukocytosis.  Vital signs stable.  Blood sugar: 80 -Admit patient on the floor.  Reviewed EKG.  On telemetry. -Check orthostatic vitals.   Reviewed medications.  Hold metoprolol, trazodone, Celexa and Namenda for now. -Reviewed CT head which came back negative for acute findings. -We will get chest x-ray.  Check magnesium, phosphorus, TSH -Continue IV fluids.  Zofran as needed for nausea and vomiting. -We will keep her n.p.o. -Consult PT/OT/SLP -On fall and seizure precautions -Neurology and palliative care has been consulted by EDP -will get EEG -Consider cardiology consult if EEG comes back within normal limit.  AKI: Creatinine: 1.48, GFR: 35 -Baseline creatinine: 1.82, GFR more than 60 -Continue IV fluid.  Avoid nephrotoxic medication.  Repeat CMP tomorrow a.m.  Hypertension: Stable -Continue amlodipine, hold metoprolol  Hyperlipidemia: Continue statin  Depression: On Celexa and trazodone at home  -We will hold for now due to prolonged QT interval   prolonged QT interval: -Reviewed EKG.  Hold trazodone, Celexa and Namenda.  Dementia: Hold Namenda  GERD: Continue PPI  CVA: Continue Aggrenox and statin  DVT prophylaxis: Lovenox/SCD/TED Code Status: DNR/DNI-confirmed with patient's daughter family Communication:None present at bedside.  Plan of care discussed with patient in length and she verbalized understanding and agreed with it.  I called patient's daughter [name]-discussed plan of care and she verbalized understanding.  Disposition Plan: Likely home in 2 days Consults called: Neurology by EDP Admission status: Inpatient   Ollen Bowl MD [facility]  If 7PM-7AM, please contact night-coverage [email or web address] Password [facility]  10/16/2019, 2:30 PM

## 2019-10-16 NOTE — ED Provider Notes (Addendum)
[facility] EMERGENCY DEPARTMENT Provider Note   CSN: [id number] Arrival date & time: 10/16/19  1059  An emergency department physician performed an initial assessment on this suspected stroke patient at 1059.  History Chief Complaint  Patient presents with  . Code Stroke    Amanda Owen is a 86 y.o. female.  HPI    Level 5 caveat for altered mental status.  86 year old female with history of CKD, hypertension, hyperlipidemia, mild cognitive delay comes in from [facility] facility with chief complaint of unresponsiveness.  According to EMS, patient was last seen normal at 745.  EMS was called when patient was found slumped forward.  When they arrived patient was unresponsive and apneic.  They started bagging her and called code stroke.  There is no known history of seizures.  Nobody witnessed a syncopal episode or seizure-like activity.  Patient is minimally responsive and has an intraosseous line in place.  Past Medical History:  Diagnosis Date  . Chronic kidney failure   . Depression   . Hyperlipidemia   . Hypertension   . Memory deficit   . Occlusion of carotid artery   . Vitamin deficiency     Patient Active Problem List   Diagnosis Date Noted  . Hyperlipidemia   . Depression   . Hypomagnesemia 10/13/2019  . Hypocalcemia 10/13/2019  . Recurrent syncope 10/09/2019  . AKI (acute kidney injury) (HCC) 10/09/2019  . History of completed stroke 10/09/2019  . Dementia (HCC) 10/09/2019  . Elevated troponin 10/09/2019  . Hypokalemia 10/09/2019  . Bacteriuria 10/09/2019  . HTN (hypertension)   . Acute cystitis without hematuria   . Syncope   . Chronic cough 07/31/2010    Past Surgical History:  Procedure Laterality Date  . ABDOMINAL HYSTERECTOMY       OB History   No obstetric history on file.     Family History  Problem Relation Age of Onset  . Heart disease Mother   . Heart disease Sister   . Heart disease Brother     Social  History   Tobacco Use  . Smoking status: Never Smoker  . Smokeless tobacco: Never Used  Vaping Use  . Vaping Use: Never assessed  Substance Use Topics  . Alcohol use: No  . Drug use: Not on file    Home Medications Prior to Admission medications   Medication Sig Start Date End Date Taking? Authorizing Provider  amLODipine (NORVASC) 5 MG tablet Take 1 tablet (5 mg total) by mouth daily. 10/11/19 10/10/20 Yes Marinda Elk, MD  atorvastatin (LIPITOR) 40 MG tablet Take 40 mg by mouth daily. 08/27/19  Yes [provider]  citalopram (CELEXA) 20 MG tablet Take 20 mg by mouth daily.     Yes [provider]  dipyridamole-aspirin (AGGRENOX) 200-25 MG per 12 hr capsule Take 1 capsule by mouth 2 (two) times daily.   Yes [provider]  memantine (NAMENDA XR) 28 MG CP24 24 hr capsule Take 28 mg by mouth daily.   Yes [provider]  metoprolol tartrate (LOPRESSOR) 25 MG tablet Take 25 mg by mouth 2 (two) times daily.   Yes [provider]  omeprazole (PRILOSEC) 20 MG capsule Take 20 mg by mouth daily.   Yes [provider]  traZODone (DESYREL) 50 MG tablet Take 50 mg by mouth at bedtime.    Yes [provider]  phosphorus (K PHOS NEUTRAL) 155-852-130 MG tablet Take 2 tablets (500 mg total) by mouth 3 (three) times  daily. 10/15/19   Burnadette Pop, MD    Allergies    Ace inhibitors  Review of Systems   Review of Systems  Unable to perform ROS: Acuity of condition    Physical Exam Updated Vital Signs BP (!) 146/58   Pulse 84   Temp 97.6 F (36.4 C) (Rectal)   Resp 16   SpO2 100%   Physical Exam Vitals and nursing note reviewed.  Constitutional:      Appearance: She is well-developed.     Comments: Patient is unresponsive without being in distress  HENT:     Head: Normocephalic and atraumatic.  Eyes:     Comments: Right eye has a lateral gaze  Cardiovascular:     Rate and Rhythm: Normal rate.  Pulmonary:      Comments: Patient on BVM getting ventilation support, positive gag reflex Abdominal:     General: Bowel sounds are normal.  Musculoskeletal:     Cervical back: Neck supple.  Skin:    General: Skin is warm.  Neurological:     Mental Status: She is disoriented.     ED Results / Procedures / Treatments   Labs (all labs ordered are listed, but only abnormal results are displayed) Labs Reviewed  CBC - Abnormal; Notable for the following components:      Result Value   RDW 16.3 (*)    All other components within normal limits  DIFFERENTIAL - Abnormal; Notable for the following components:   Abs Immature Granulocytes 0.11 (*)    All other components within normal limits  COMPREHENSIVE METABOLIC PANEL - Abnormal; Notable for the following components:   CO2 19 (*)    Glucose, Bld 110 (*)    Creatinine, Ser 1.48 (*)    Calcium 8.7 (*)    Total Protein 6.4 (*)    Albumin 2.7 (*)    GFR calc non Af Amer 30 (*)    GFR calc Af Amer 35 (*)    All other components within normal limits  I-STAT CHEM 8, ED - Abnormal; Notable for the following components:   Creatinine, Ser 1.40 (*)    Glucose, Bld 104 (*)    All other components within normal limits  SARS CORONAVIRUS 2 BY RT PCR (HOSPITAL ORDER, PERFORMED IN [place] [facility])  CBC  CREATININE, SERUM  MAGNESIUM  PHOSPHORUS  TSH  URINALYSIS, ROUTINE W REFLEX MICROSCOPIC  CBG MONITORING, ED    EKG EKG Interpretation  Date/Time:  Saturday October 16 2019 11:08:17 EDT Ventricular Rate:  87 PR Interval:    QRS Duration: 88 QT Interval:  407 QTC Calculation: 490 R Axis:   6 Text Interpretation: Atrial fibrillation Ventricular premature complex Abnormal R-wave progression, early transition Left ventricular hypertrophy Borderline prolonged QT interval No acute changes Confirmed by Derwood Kaplan [phone]) on 10/16/2019 12:05:41 PM   Radiology CT HEAD WO CONTRAST  Result Date: 10/16/2019 CLINICAL DATA:  Hypoxia. Unresponsive.  Possible CVA. No reported injury. EXAM: CT HEAD WITHOUT CONTRAST TECHNIQUE: Contiguous axial images were obtained from the base of the skull through the vertex without intravenous contrast. COMPARISON:  10/09/2019 head CT. FINDINGS: Brain: No evidence of parenchymal hemorrhage or extra-axial fluid collection. No mass lesion, mass effect, or midline shift. No CT evidence of acute infarction. Generalized cerebral volume loss. Nonspecific moderate subcortical and periventricular white matter hypodensity, most in keeping with chronic small vessel ischemic change. Cerebral ventricle sizes are stable and concordant with the degree of cerebral volume loss. Vascular: No acute abnormality. Skull:  No evidence of calvarial fracture. Osteitis frontalis interna. Sinuses/Orbits: The visualized paranasal sinuses are essentially clear. Other:  The mastoid air cells are unopacified. IMPRESSION: 1. No evidence of acute intracranial abnormality. 2. Generalized cerebral volume loss and moderate chronic small vessel ischemic changes in the cerebral white matter. Electronically Signed   By: Delbert Phenix M.D.   On: 10/16/2019 13:44    Procedures .Critical Care Performed by: Derwood Kaplan, MD Authorized by: Derwood Kaplan, MD   Critical care provider statement:    Critical care time (minutes):  35   Critical care was necessary to treat or prevent imminent or life-threatening deterioration of the following conditions:  Respiratory failure   Critical care was time spent personally by me on the following activities:  Discussions with consultants, evaluation of patient's response to treatment, examination of patient, ordering and performing treatments and interventions, ordering and review of laboratory studies, ordering and review of radiographic studies, pulse oximetry, re-evaluation of patient's condition, obtaining history from patient or surrogate and review of old charts   (including critical care time)  Medications  Ordered in ED Medications  sodium chloride flush (NS) 0.9 % injection 3 mL (has no administration in time range)  lactated ringers infusion (has no administration in time range)  enoxaparin (LOVENOX) injection 30 mg (has no administration in time range)  0.9 %  sodium chloride infusion (has no administration in time range)  acetaminophen (TYLENOL) tablet 650 mg (has no administration in time range)    Or  acetaminophen (TYLENOL) suppository 650 mg (has no administration in time range)  ondansetron (ZOFRAN) tablet 4 mg (has no administration in time range)    Or  ondansetron (ZOFRAN) injection 4 mg (has no administration in time range)  amLODipine (NORVASC) tablet 5 mg (has no administration in time range)  atorvastatin (LIPITOR) tablet 40 mg (has no administration in time range)  citalopram (CELEXA) tablet 20 mg (has no administration in time range)  memantine (NAMENDA XR) 24 hr capsule 28 mg (has no administration in time range)  pantoprazole (PROTONIX) EC tablet 40 mg (has no administration in time range)  dipyridamole-aspirin (AGGRENOX) 200-25 MG per 12 hr capsule 1 capsule (has no administration in time range)    ED Course  I have reviewed the triage vital signs and the nursing notes.  Pertinent labs & imaging results that were available during my care of the patient were reviewed by me and considered in my medical decision making (see chart for details).  Clinical Course as of Oct 16 1430  Sat Oct 16, 2019  1432 Patient has been removed from nonrebreather and placed on 4 L of oxygen.  Thereafter I reassessed her and discontinued oxygen altogether.  She was saturating well.  Nursing staff made aware.   [AN]    Clinical Course User Index [AN] Derwood Kaplan, MD   MDM Rules/Calculators/A&P                          86 year old comes in a chief complaint of unresponsiveness.  Code stroke was activated on field.  Patient arrives with humeral IO and also getting assistance with  respirations via BVM.  Patient has a mild gag reflex. She is noted to to be moving all 4 extremities.  Patient is not responsive.  I had taken care of patient few days back when she was admitted for syncope.  Patient was responsive at that time and it appears that at the time of discharge she  was also responsive.   Likely she had a syncope.  Other possibility includes seizure.  Patient is hypoxic, unsure if hypoxia was the reason she had a syncopal episode or altered mental status. Code stroke was activated.  Neurology team at the bedside.  They have discontinued the code stroke.  Patient's labs are reassuring besides mild AKI. It appears that patient is a memory unit.  It is unclear what type of supervision she gets there.  She likely will need higher level of care than what she is getting at the facility she is at presently.  Additionally I think she also might benefit with palliative care consult for goals of care and perhaps enrollment into hospice.  It appears that she is declining and is in failure to thrive.    Final Clinical Impression(s) / ED Diagnoses Final diagnoses:  AKI (acute kidney injury) ([place])  Acute encephalopathy  Syncope and collapse    Rx / DC Orders ED Discharge Orders    None       Varney Biles, MD 10/16/19 [place], Amanda Ortner, MD 10/16/19 1432

## 2019-10-16 NOTE — Progress Notes (Signed)
Consult placed to IV Team;  Per RN, pt is a left arm only stick; had no Restricted extremity band on the right wrist;  Attempted x 2 to place iv in the left arm, using ultrasound;  VERY POOR ACCESS;  Another IV RN to assess; suggest central access; Has IO in left shoulder, but per RN, "it is not working";

## 2019-10-16 NOTE — ED Triage Notes (Signed)
Pt to ED via EMS from [facility] [place]. Per nursing staff- pt last known normal was 0745. On EMS arrival pt was hypoxic, unresponsive and slumped over in [facility] EMS actively bagging pt in field and on arrival to ED . Per EMS pt right side restricted- unknown why. Code stroke activated by EMS at 1053. Last VS: 148/96, HR 80, CBG 136. IO L.Humerous placed by EMS. Pt placed on nonrebreather and able to maintain airway.

## 2019-10-16 NOTE — ED Notes (Signed)
Attempted to call nursing report to 5M 

## 2019-10-16 NOTE — Code Documentation (Signed)
Patient arrived at 31  Code Stroke called at 1053 while en route.  Per EMS patient was lkw at 0745 this am "alert" and able to speak.  Staff found her "slumped over in her wheel chair"  EMS called.  Per EMS upon their arrival she was cold to the touch, unresponsive with periods of apnea.  Upon arrival EMS was ventilating via ambu bag.  EDP and Dr Amada Jupiter at bedside to assess patient.  She is currently maintaining her airway.  Placed on O2.  No focal deficits.  Code Stroke canceled per Dr Amada Jupiter.

## 2019-10-16 NOTE — ED Notes (Signed)
Code stroke cancelled by Dr. Kirkpatrick.  

## 2019-10-16 NOTE — ED Notes (Signed)
Pt o2 titrated down- now on room air, o2 sats 99%. Will continue to monitor.

## 2019-10-16 NOTE — Consult Note (Signed)
Palliative Medicine Inpatient Consult Note  Reason for consult:  Discuss Goals of care, Hospice in setting of decline and failure to thrive.  HPI:  Per intake H&P "Amanda Owen is a 86 y.o. female with medical history significant of hypertension, hyperlipidemia, dementia, depression, CVA,  recurrent syncope brought by EMS to emergency department for the concern syncopal episode.  Per EMS: Patient was LKW at 0745 this a.m when she was up in her wheelchair eating breakfast  Alert and talking.staff went and check on her & she was found slumped over in the wheelchair therefore EMS was called.  Upon EMS arrival patient was cold to touch, unresponsive with period Of apnea.  Patient was being bagged by EMS and brought patient to ER for further evaluation and management.  Code stroke was called and CT head was obtained which came back negative for stroke.  Code stroke was canceled.  Patient was evaluated by neurology in ED.  No history of seizure, head trauma, fall, vomiting, fever, urinary or bowel incontinence.  Patient recently admitted at [facility] with syncopal episodes QT interval and electrolyte imbalance.  She discharged recently on 6/9."    Clinical Assessment/Goals of Care: I have reviewed medical records including EPIC notes, labs and imaging, received report from bedside RN Festus Aloe in ED, and assessed the patient.    I met with by phone with Amanda Owen [name] [phone] to further discuss diagnosis prognosis, GOC, EOL wishes, disposition and options.   I introduced Palliative Medicine as specialized medical care for people living with serious illness. It focuses on providing relief from the symptoms and stress of a serious illness. The goal is to improve quality of life for both the patient and the family.  A detailed discussion was had today regarding advanced directives.  Concepts specific to code status, artifical feeding and hydration, continued IV  antibiotics and rehospitalization was had.  The difference between a aggressive medical intervention path  and a palliative comfort care path for this patient at this time was had. Values and goals of care important to patient and family were attempted to be elicited.   I asked Amanda. Owen to tell me about her mother. She shared that her mother [place] in [place] but worked in [place] for [employer]  for 40 years. When she retired she moved back to the family home in [place] and lived there for 25-28 years. Ten year ago both Amanda. Owen and her husband at the time moved to [place] with their daughter.  There is a son who lives in [place].  In 07-05-2016 [name] died. Amanda. Owen has continued to live with her daughter. Until an admission to [facility] a month ago patient lived with her daughter and there was help 4 hours a day to assist with getting her up, washed and fed. She has walked with a walker for 5 years. Her weight at that time was 160 lb. Amanda. 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 Amanda. Owen was unable to walk at discharge from [facility] and was discharged to [facility] with plans for PT, but daughter states she has not had PT. The daughter feels Amanda. Owen has continued to deteriorate while at [facility]. A lower denture plate broke 2 weeks ago and patient can only eat soft foods which they have not been providing. A previous discharge note 10/08/19(two discharges ago)  mentions palliative care consult at nursing facility but daughter states this has not occurred. The daughter feels that the not maintaining and loosing weight is due to the types of food Amanda. Owen is being given. Amanda. Owen likes protein drinks, puddings, jello, and applesauce that she can easily eat without chewing. The daughter had made a dental appointment for the patient but she states [facility]  did not have transportation that day and the  patient did not make it to the appointment.   Other information obtained was a history of left carotid artery blockage diagnosed 10 years ago but was not amenable to surgery at that time. Patient has had a history of passing out every 3-6 months over the last 10 years but does not have a history of stopping breathing with the episodes..   Amanda. Owen gets joy from talking to people, listening/watching TV and listening to [place] but goodies and [employer], and the news.   Discussed the importance of continued conversation with family and their  medical providers regarding overall plan of care and treatment options, ensuring decisions are within the context of the patients values and GOCs.  Plan for in person meeting on Sunday, July 13th to discuss potential Hospice referral. PMT to call daughter in am to schedule time. Daughter brought up topic.   Decision Maker: [name] [phone]  SUMMARY OF RECOMMENDATIONS   Optimize medical treatment DNAR/DNI Hospice discussion tomorrow with daughter  PMT will continue to follow  Code Status/Advance Care Planning: DNAR/DNI There are no existing ACP documents.   Symptom Management:  Dementia- was on Namenda, held now due to prolonged QT interval, orient to environment   Palliative Prophylaxis:   Aspiration precautions, bowel regimen, frequent pain assessment, oral care, turn reposition, fall precautions  Additional Recommendations (Limitations, Scope, Preferences):  No ventilator, no feeding tube, no dialysis per daughter   Psycho-social/Spiritual:   Desire for further Chaplaincy support: yes, Religion [employer]  Additional Recommendations: Family support.   Prognosis: weeks to months if she continues with these episodes and FTT.  Discharge Planning: Discussion planned for tomorrow in person regarding Hospice. Daughter does not want her to go back to [facility].  ROS unable to assess due to mental status  Vitals with BMI  10/16/2019 10/16/2019 10/16/2019  Height - - -  Weight - - -  BMI - - -  Systolic [phone]  Diastolic 59 73 59  Pulse 81 82 71    Physical Exam Constitutional: In no acute distress Neuro: alert, follow simple commands Respiratory: respirations nonlabored, BLBS = clear CV: S1S2 RRR, no edema, PPP x 4 extremities 2+ Abdomen: round, soft, nontender, no masses, bowel sounds normoactive Amanda: SAME Skin: dressing in place right ankle medial aspect  PPS:40%    Time In: 15:30 Time Out:16:40 Total Time: 70 minutes Greater than 50%  of this time was spent counseling and coordinating care related to the above assessment and plan.  Lindell Spar, NP [facility] Palliative Medicine Team Team Cell Phone: [phone] Please utilize secure chat with additional questions, if there is no response within 30 minutes please call the above phone number  Palliative Medicine Team providers are available by phone from 7am to 7pm daily and can be reached through the team cell phone.  Should this patient require assistance outside of these hours, please call the patient's attending physician.

## 2019-10-16 NOTE — ED Notes (Signed)
IV team at bedside 

## 2019-10-16 NOTE — ED Notes (Signed)
RN unable to obtain IV Access- IV team consult placed, EDP aware

## 2019-10-16 NOTE — ED Notes (Signed)
Palliative care at bedside.

## 2019-10-16 NOTE — ED Notes (Signed)
Received call from lab stating PT/INR blue top not able to be ran , this nurse notified EDP Nanavati- no new orders given. Ok to cancel order.

## 2019-10-16 NOTE — ED Notes (Signed)
Got patient undress into a gown on the monitor did ekg patient is resting

## 2019-10-16 NOTE — ED Notes (Signed)
EDP at bedside  

## 2019-10-16 NOTE — ED Notes (Signed)
Pt transported to CT ?

## 2019-10-16 NOTE — Consult Note (Addendum)
NEURO HOSPITALIST CONSULT NOTE   Requesting physician: Dr. Rhunette Croft   Reason for Consult:unresponsive,hypotensive   History obtained from:  EMS/ nurse at [facility]/ chart review HPI:                                                                                                                                          Amanda Owen is an 86 y.o. female  With PMH dementia, CVA, HTN, HLD, CKD 3 who presented to [facility] ED as a code stroke for unresponsiveness, flaccid extremities. Code stroke canceled.   Per EMS and nurse a [facility]) patient was LKW this morning at 0745 when she was up in her w/c eating breakfast. At that time she was alert and talking. When she finished her med pass and went to check on her she found her slumped over in the chair, non responsive and her extremities were cold. She was breathing, but very deep and slowly. Patient was being bagged when she came into the building and she was taken to a room to be intubated.  She did start protecting her airway and intubation was aborted for the time being.  Of note patient was recently admitted at [facility] hospital  for recurrent syncopal episodes. She was discharged 6/9 s he was noted to have QT prolongation  NIHSS: 21  Past Medical History:  Diagnosis Date   Chronic kidney failure    Depression    Hyperlipidemia    Hypertension    Memory deficit    Occlusion of carotid artery    Vitamin deficiency     Past Surgical History:  Procedure Laterality Date   ABDOMINAL HYSTERECTOMY      Family History  Problem Relation Age of Onset   Heart disease Mother    Heart disease Sister    Heart disease Brother           Social History:  reports that she has never smoked. She has never used smokeless tobacco. She reports that she does not drink alcohol. No history on file for drug use.  Allergies  Allergen Reactions   Ace Inhibitors Other (See Comments)    unknown    MEDICATIONS:  Current Facility-Administered Medications  Medication Dose Route Frequency Provider Last Rate Last Admin   sodium chloride flush (NS) 0.9 % injection 3 mL  3 mL Intravenous Once Derwood Kaplan, MD       Current Outpatient Medications  Medication Sig Dispense Refill   ALPRAZolam (XANAX) 0.25 MG tablet Take 0.25 mg by mouth every 8 (eight) hours as needed for anxiety.     amLODipine (NORVASC) 5 MG tablet Take 1 tablet (5 mg total) by mouth daily. (Patient not taking: Reported on 10/13/2019) 30 tablet 11   atorvastatin (LIPITOR) 40 MG tablet Take 40 mg by mouth daily.     citalopram (CELEXA) 20 MG tablet Take 20 mg by mouth daily.       dipyridamole-aspirin (AGGRENOX) 200-25 MG per 12 hr capsule Take 1 capsule by mouth 2 (two) times daily.     memantine (NAMENDA XR) 28 MG CP24 24 hr capsule Take 28 mg by mouth daily.     metoprolol tartrate (LOPRESSOR) 25 MG tablet Take 25 mg by mouth 2 (two) times daily.     omeprazole (PRILOSEC) 20 MG capsule Take 20 mg by mouth daily.     phosphorus (K PHOS NEUTRAL) 155-852-130 MG tablet Take 2 tablets (500 mg total) by mouth 3 (three) times daily. 6 tablet 0   traZODone (DESYREL) 50 MG tablet Take 25 mg by mouth 2 (two) times daily as needed for sleep.         ROS:                                                                                                                                        unobtainable from patient due to mental status  Blood pressure 123/69, pulse 86, SpO2 100 %.   General Examination:                                                                                                       Physical Exam  Constitutional: Appears well-developed and well-nourished.  Psych: Affect appropriate to situation Eyes: Normal external eye and conjunctiva. HENT: Normocephalic, no lesions, without obvious abnormality.    Musculoskeletal- contracted RUE. Cardiovascular: Normal rate and regular rhythm.  Respiratory: Effort normal, non-labored breathing saturations WNL GI: Soft.  No distension. There is no tenderness.  Skin: WDI  Neurological Examination Mental Status: No verbalizations noted. Not following commands. Cranial Nerves: Right eye with exotropia. PERRL.  Motor/sensory: Patient attempts to localize and withdraws to pain in all 4 extremities.  Cerebellar: uta Gait:deferred   Lab Results: Basic Metabolic Panel: Recent Labs  Lab 10/10/19 0255 10/10/19 0255 10/11/19 0354 10/11/19 0354 10/13/19 1552 10/13/19 1552 10/13/19 1920 10/14/19 0549 10/15/19 0516  NA 143  --  139  --  144  --   --  140  --   K 3.7  --  4.2  --  2.9*  --   --  5.0  --   CL 108  --  104  --  119*  --   --  111  --   CO2 24  --  24  --  17*  --   --  22  --   GLUCOSE 94  --  93  --  75  --   --  91  --   BUN 18  --  14  --  20  --   --  22  --   CREATININE 1.17*  --  1.16*  --  0.67  --   --  0.82  --   CALCIUM 8.7*   < > 8.7*   < > 5.8*   < > 8.7* 8.4*   8.1*  --   MG  --   --   --   --  1.5*  --   --  2.5*  --   PHOS  --   --   --   --   --   --   --  1.6* 2.0*   < > = values in this interval not displayed.    CBC: Recent Labs  Lab 10/10/19 0255 10/11/19 0354 10/13/19 1552 10/14/19 0549  WBC 8.3 9.5 7.3 7.5  NEUTROABS  --   --  5.6  --   HGB 11.8* 13.0 12.5 11.1*  HCT 37.4 41.6 41.0 34.9*  MCV 93.5 93.9 97.2 94.1  PLT 254 275 229 224     Imaging: No results found.  Assessment: 86 y.o. female  With PMH dementia, CVA, HTN, HLD, CKD 3 who presented to [facility] ED as a code stroke for unresponsiveness, flaccid extremities. Code stroke canceled. CTH pending.  Recommendations: CTH  Laurey Morale, MSN, NP-C [facility] [phone]  Attending neurologist's note to follow I have seen the patient reviewed the above note.  The description sounds much more consistent with syncope  than seizure, however given that she has continued to have recurrent episodes another EEG would be reasonable though I think it is low yield.  My suspicion is that this is not seizure, so I am therefore hesitant to start an AED unless EEG is positive.  If she does continue to have spells and no explanation is continued to be found, then could consider antiepileptic med trial, but again I am hesitant to do that at this time.  Roland Rack, MD [facility] [phone]  If 7pm- 7am, please page neurology on call as listed in Page.  10/16/2019, 11:26 AM

## 2019-10-17 DIAGNOSIS — N179 Acute kidney failure, unspecified: Secondary | ICD-10-CM

## 2019-10-17 DIAGNOSIS — F0391 Unspecified dementia with behavioral disturbance: Secondary | ICD-10-CM

## 2019-10-17 DIAGNOSIS — I10 Essential (primary) hypertension: Secondary | ICD-10-CM

## 2019-10-17 DIAGNOSIS — Z7189 Other specified counseling: Secondary | ICD-10-CM

## 2019-10-17 DIAGNOSIS — Z66 Do not resuscitate: Secondary | ICD-10-CM

## 2019-10-17 DIAGNOSIS — Z515 Encounter for palliative care: Secondary | ICD-10-CM

## 2019-10-17 LAB — COMPREHENSIVE METABOLIC PANEL
ALT: 20 U/L (ref 0–44)
AST: 26 U/L (ref 15–41)
Albumin: 2.3 g/dL — ABNORMAL LOW (ref 3.5–5.0)
Alkaline Phosphatase: 68 U/L (ref 38–126)
Anion gap: 11 (ref 5–15)
BUN: 16 mg/dL (ref 8–23)
CO2: 21 mmol/L — ABNORMAL LOW (ref 22–32)
Calcium: 8.1 mg/dL — ABNORMAL LOW (ref 8.9–10.3)
Chloride: 107 mmol/L (ref 98–111)
Creatinine, Ser: 0.91 mg/dL (ref 0.44–1.00)
GFR calc Af Amer: >60 mL/min (ref >60)
GFR calc non Af Amer: 54 mL/min — ABNORMAL LOW (ref >60)
Glucose, Bld: 86 mg/dL (ref 70–99)
Potassium: 4.2 mmol/L (ref 3.5–5.1)
Sodium: 139 mmol/L (ref 135–145)
Total Bilirubin: 0.9 mg/dL (ref 0.3–1.2)
Total Protein: 5.7 g/dL — ABNORMAL LOW (ref 6.5–8.1)

## 2019-10-17 LAB — CBC
HCT: 35.6 % — ABNORMAL LOW (ref 36.0–46.0)
Hemoglobin: 11.5 g/dL — ABNORMAL LOW (ref 12.0–15.0)
MCH: 30.3 pg (ref 26.0–34.0)
MCHC: 32.3 g/dL (ref 30.0–36.0)
MCV: 93.9 fL (ref 80.0–100.0)
Platelets: 312 10*3/uL (ref 150–400)
RBC: 3.79 MIL/uL — ABNORMAL LOW (ref 3.87–5.11)
RDW: 16.3 % — ABNORMAL HIGH (ref 11.5–15.5)
WBC: 7.8 10*3/uL (ref 4.0–10.5)
nRBC: 0 % (ref 0.0–0.2)

## 2019-10-17 LAB — TSH: TSH: 1.398 u[IU]/mL (ref 0.350–4.500)

## 2019-10-17 NOTE — Progress Notes (Addendum)
[facility]  PROGRESS NOTE  Amanda Owen YQM:[id number] DOB: Feb 23, 1923 DOA: 10/16/2019 PCP: Charlesetta Shanks, MD   Brief HPI:   86 year old female with history of hypertension, hyperlipidemia, dementia, depression, CVA, recurrent syncope brought by EMS from facility after she had syncopal episode at the facility.  As per EMS patient was found slumped over in the wheelchair so EMS was called.  Patient was cool to touch, unresponsive with.  Apnea.  She was bagged by EMS and was brought to the ED for further evaluation. Was called, CT head was negative so code stroke was canceled. Neurology and palliative care were consulted.    Subjective   Patient seen and examined, pleasantly confused.  No acute issues.   Assessment/Plan:     1. Syncope/unresponsiveness-patient has history of recurrent syncopal episodes.  CT head was negative.  Neurology saw the patient and recommended EEG.  Seizure precautions, continued on telemetry.  PT consulted.   2. Acute kidney injury-resolved, patient presented with creatinine of 1.48.  Baseline creatinine of 0.82.  Today creatinine improved to 0.91.   3. Hypertension-continue amlodipine.  Metoprolol is on hold. 4. Hyperlipidemia-continue statin 5. Depression-continue Celexa, trazodone 6. Prolonged QT interval-very minimal QT prolongation, QTC is 490.  Magnesium was 2.1.  Potassium 4.3.  Continue to monitor on telemetry. 7. Goals of care-palliative care consulted for discussion of goals of care.  Patient is DNR.  Final recommendation is currently pending.    SpO2: 98 %     Lab Results  Component Value Date   SARSCOV2NAA NEGATIVE 10/16/2019   SARSCOV2NAA NEGATIVE 10/13/2019   SARSCOV2NAA NEGATIVE 10/09/2019     CBG: Recent Labs  Lab 10/10/19 1113 10/10/19 1615 10/10/19 2149 10/11/19 0739 10/16/19 1113  GLUCAP 76 82 76 81 80    CBC: Recent Labs  Lab 10/11/19 0354 10/11/19 0354 10/13/19 1552 10/14/19 0549 10/16/19 1253 10/16/19 1255  10/17/19 0638  WBC 9.5  --  7.3 7.5 8.7  --  7.8  NEUTROABS  --   --  5.6  --  6.5  --   --   HGB 13.0   < > 12.5 11.1* 12.7 13.3 11.5*  HCT 41.6   < > 41.0 34.9* 40.3 39.0 35.6*  MCV 93.9  --  97.2 94.1 94.4  --  93.9  PLT 275  --  229 224 228  --  312   < > = values in this interval not displayed.    Basic Metabolic Panel: Recent Labs  Lab 10/11/19 0354 10/11/19 0354 10/13/19 1552 10/13/19 1552 10/13/19 1920 10/14/19 0549 10/15/19 0516 10/16/19 1253 10/16/19 1255 10/17/19 0638  NA 139   < > 144  --   --  140  --  139 140 139  K 4.2   < > 2.9*  --   --  5.0  --  4.5 4.3 4.2  CL 104   < > 119*  --   --  111  --  107 108 107  CO2 24  --  17*  --   --  22  --  19*  --  21*  GLUCOSE 93   < > 75  --   --  91  --  110* 104* 86  BUN 14   < > 20  --   --  22  --  21 20 16   CREATININE 1.16*   < > 0.67  --   --  0.82  --  1.48* 1.40* 0.91  CALCIUM 8.7*   < >  5.8*   < > 8.7* 8.4*  8.1*  --  8.7*  --  8.1*  MG  --   --  1.5*  --   --  2.5*  --  2.1  --   --   PHOS  --   --   --   --   --  1.6* 2.0* 3.4  --   --    < > = values in this interval not displayed.     Liver Function Tests: Recent Labs  Lab 10/13/19 1552 10/16/19 1253 10/17/19 0638  AST 24 29 26   ALT 14 18 20   ALKPHOS 52 75 68  BILITOT 0.7 1.0 0.9  PROT 4.5* 6.4* 5.7*  ALBUMIN 2.0* 2.7* 2.3*        DVT prophylaxis: Lovenox  Code Status: Full code  Family Communication: No family at bedside    Status is: Inpatient  Dispo: The patient is from: Skilled nursing facility              Anticipated d/c is to: Skilled nursing facility              Anticipated d/c date is: 10/18/2019              Patient currently not medically stable for discharge  Barrier to discharge-awaiting EEG and final palliative care recommendation.  Pressure Injury 10/13/19 Heel Left Deep Tissue Pressure Injury - Purple or maroon localized area of discolored intact skin or blood-filled blister due to damage of underlying soft  tissue from pressure and/or shear. (Active)  10/13/19 2301  Location: Heel  Location Orientation: Left  Staging: Deep Tissue Pressure Injury - Purple or maroon localized area of discolored intact skin or blood-filled blister due to damage of underlying soft tissue from pressure and/or shear.  Wound Description (Comments):   Present on Admission: Yes     Pressure Injury 10/14/19 Foot Left Deep Tissue Pressure Injury - Purple or maroon localized area of discolored intact skin or blood-filled blister due to damage of underlying soft tissue from pressure and/or shear. inner anterior foot (Active)  10/14/19   Location: Foot  Location Orientation: Left  Staging: Deep Tissue Pressure Injury - Purple or maroon localized area of discolored intact skin or blood-filled blister due to damage of underlying soft tissue from pressure and/or shear.  Wound Description (Comments): inner anterior foot  Present on Admission: Yes       Scheduled medications:  . amLODipine  5 mg Oral Daily  . atorvastatin  40 mg Oral Daily  . dipyridamole-aspirin  1 capsule Oral BID  . enoxaparin (LOVENOX) injection  30 mg Subcutaneous Q24H  . pantoprazole  40 mg Oral Daily  . sodium chloride flush  3 mL Intravenous Once    Consultants:  Neurology  Palliative care  Procedures:  None  Antibiotics:   Anti-infectives (From admission, onward)   None       Objective   Vitals:   10/16/19 1654 10/16/19 2105 10/17/19 0501 10/17/19 0900  BP: (!) 146/71 (!) 177/70 (!) 160/75 (!) 148/78  Pulse: 91 74 79 78  Resp: 16 16 16 18   Temp: 97.9 F (36.6 C) 98.2 F (36.8 C) 98.4 F (36.9 C) 98.5 F (36.9 C)  TempSrc: Oral   Oral  SpO2: 98% 97% 95% 98%  Weight: 67 kg 67 kg    Height: 5\' 4"  (1.626 m)       Intake/Output Summary (Last 24 hours) at 10/17/2019 1102 Last data filed at 10/17/2019  0900 Gross per 24 hour  Intake 1440.05 ml  Output 325 ml  Net 1115.05 ml    06/11 1901 - 06/13 0700 In: 1440.1  [I.V.:1440.1] Out: 250 [Urine:250]  Filed Weights   10/16/19 1654 10/16/19 2105  Weight: 67 kg 67 kg    Physical Examination:    General: Appears pleasantly confused  Cardiovascular: S1-S2, regular, no murmur auscultated  Respiratory: Clear to auscultation bilaterally, no wheezing or crackles auscultated  Abdomen: S1-S2, regular, no murmur auscultated  Extremities: Abdomen is soft, nontender, no organomegaly  Neurologic: Alert, oriented to self only, no focal deficit noted    Data Reviewed:   Recent Results (from the past 240 hour(Amanda))  Urine culture     Status: Abnormal   Collection Time: 10/09/19 12:13 AM   Specimen: Urine, Catheterized  Result Value Ref Range Status   Specimen Description URINE, CATHETERIZED  Final   Special Requests   Final    NONE Performed at [facility], [address], [place], [place] [address]    Culture >=100,000 COLONIES/mL ESCHERICHIA COLI (A)  Final   Report Status 10/11/2019 FINAL  Final   Organism ID, Bacteria ESCHERICHIA COLI (A)  Final      Susceptibility   Escherichia coli - MIC*    AMPICILLIN <=2 SENSITIVE Sensitive     CEFAZOLIN <=4 SENSITIVE Sensitive     CEFTRIAXONE <=1 SENSITIVE Sensitive     CIPROFLOXACIN <=0.25 SENSITIVE Sensitive     GENTAMICIN <=1 SENSITIVE Sensitive     IMIPENEM <=0.25 SENSITIVE Sensitive     NITROFURANTOIN <=16 SENSITIVE Sensitive     TRIMETH/SULFA <=20 SENSITIVE Sensitive     AMPICILLIN/SULBACTAM <=2 SENSITIVE Sensitive     PIP/TAZO <=4 SENSITIVE Sensitive     * >=100,000 COLONIES/mL ESCHERICHIA COLI  SARS Coronavirus 2 by RT PCR (hospital order, performed in [facility] hospital lab) Nasopharyngeal Nasopharyngeal Swab     Status: None   Collection Time: 10/09/19  7:49 AM   Specimen: Nasopharyngeal Swab  Result Value Ref Range Status   SARS Coronavirus 2 NEGATIVE NEGATIVE Final    Comment: (NOTE) SARS-CoV-2 target nucleic acids are NOT DETECTED. The SARS-CoV-2 RNA is generally  detectable in upper and lower respiratory specimens during the acute phase of infection. The lowest concentration of SARS-CoV-2 viral copies this assay can detect is 250 copies / mL. A negative result does not preclude SARS-CoV-2 infection and should not be used as the sole basis for treatment or other patient management decisions.  A negative result may occur with improper specimen collection / handling, submission of specimen other than nasopharyngeal swab, presence of viral mutation(Amanda) within the areas targeted by this assay, and inadequate number of viral copies (<250 copies / mL). A negative result must be combined with clinical observations, patient history, and epidemiological information. Fact Sheet for Patients:   BoilerBrush.com.cy Fact Sheet for Healthcare Providers: https://pope.com/ This test is not yet approved or cleared  by the [place] FDA and has been authorized for detection and/or diagnosis of SARS-CoV-2 by FDA under an Emergency Use Authorization (EUA).  This EUA will remain in effect (meaning this test can be used) for the duration of the COVID-19 declaration under Section 564(b)(1) of the Act, 21 U.Amanda.C. section 360bbb-3(b)(1), unless the authorization is terminated or revoked sooner. Performed at [facility], [address], [place], [place] [address]   SARS Coronavirus 2 by RT PCR (hospital order, performed in [facility] hospital lab) Nasopharyngeal Nasopharyngeal Swab  Status: None   Collection Time: 10/13/19  6:15 PM   Specimen: Nasopharyngeal Swab  Result Value Ref Range Status   SARS Coronavirus 2 NEGATIVE NEGATIVE Final    Comment: (NOTE) SARS-CoV-2 target nucleic acids are NOT DETECTED. The SARS-CoV-2 RNA is generally detectable in upper and lower respiratory specimens during the acute phase of infection. The lowest concentration of SARS-CoV-2 viral copies this assay can detect is  250 copies / mL. A negative result does not preclude SARS-CoV-2 infection and should not be used as the sole basis for treatment or other patient management decisions.  A negative result may occur with improper specimen collection / handling, submission of specimen other than nasopharyngeal swab, presence of viral mutation(Amanda) within the areas targeted by this assay, and inadequate number of viral copies (<250 copies / mL). A negative result must be combined with clinical observations, patient history, and epidemiological information. Fact Sheet for Patients:   BoilerBrush.com.cyhttps://www.fda.gov/media/136312/download Fact Sheet for Healthcare Providers: https://pope.com/https://www.fda.gov/media/136313/download This test is not yet approved or cleared  by the [place] FDA and has been authorized for detection and/or diagnosis of SARS-CoV-2 by FDA under an Emergency Use Authorization (EUA).  This EUA will remain in effect (meaning this test can be used) for the duration of the COVID-19 declaration under Section 564(b)(1) of the Act, 21 U.Amanda.C. section 360bbb-3(b)(1), unless the authorization is terminated or revoked sooner. Performed at [facility], [address] [place], [place] [address]   MRSA PCR Screening     Status: None   Collection Time: 10/14/19  5:30 AM   Specimen: Nasal Mucosa; Nasopharyngeal  Result Value Ref Range Status   MRSA by PCR NEGATIVE NEGATIVE Final    Comment:        The GeneXpert MRSA Assay (FDA approved for NASAL specimens only), is one component of a comprehensive MRSA colonization surveillance program. It is not intended to diagnose MRSA infection nor to guide or monitor treatment for MRSA infections. Performed at [facility], [address] [place], [place] [address]   SARS Coronavirus 2 by RT PCR (hospital order, performed in [facility] hospital lab) Nasopharyngeal Nasopharyngeal Swab     Status: None   Collection Time: 10/16/19   2:23 PM   Specimen: Nasopharyngeal Swab  Result Value Ref Range Status   SARS Coronavirus 2 NEGATIVE NEGATIVE Final    Comment: (NOTE) SARS-CoV-2 target nucleic acids are NOT DETECTED.  The SARS-CoV-2 RNA is generally detectable in upper and lower respiratory specimens during the acute phase of infection. The lowest concentration of SARS-CoV-2 viral copies this assay can detect is 250 copies / mL. A negative result does not preclude SARS-CoV-2 infection and should not be used as the sole basis for treatment or other patient management decisions.  A negative result may occur with improper specimen collection / handling, submission of specimen other than nasopharyngeal swab, presence of viral mutation(Amanda) within the areas targeted by this assay, and inadequate number of viral copies (<250 copies / mL). A negative result must be combined with clinical observations, patient history, and epidemiological information.  Fact Sheet for Patients:   BoilerBrush.com.cyhttps://www.fda.gov/media/136312/download  Fact Sheet for Healthcare Providers: https://pope.com/https://www.fda.gov/media/136313/download  This test is not yet approved or  cleared by the [place] FDA and has been authorized for detection and/or diagnosis of SARS-CoV-2 by FDA under an Emergency Use Authorization (EUA).  This EUA will remain in effect (meaning this test can be used) for the duration of the COVID-19 declaration under Section 564(b)(1) of  the Act, 21 U.Amanda.C. section 360bbb-3(b)(1), unless the authorization is terminated or revoked sooner.  Performed at [facility], [address], [place], [place] [address]      Studies:  CT HEAD WO CONTRAST  Result Date: 10/16/2019 CLINICAL DATA:  Hypoxia. Unresponsive. Possible CVA. No reported injury. EXAM: CT HEAD WITHOUT CONTRAST TECHNIQUE: Contiguous axial images were obtained from the base of the skull through the vertex without intravenous contrast. COMPARISON:  10/09/2019 head CT. FINDINGS:  Brain: No evidence of parenchymal hemorrhage or extra-axial fluid collection. No mass lesion, mass effect, or midline shift. No CT evidence of acute infarction. Generalized cerebral volume loss. Nonspecific moderate subcortical and periventricular white matter hypodensity, most in keeping with chronic small vessel ischemic change. Cerebral ventricle sizes are stable and concordant with the degree of cerebral volume loss. Vascular: No acute abnormality. Skull: No evidence of calvarial fracture. Osteitis frontalis interna. Sinuses/Orbits: The visualized paranasal sinuses are essentially clear. Other:  The mastoid air cells are unopacified. IMPRESSION: 1. No evidence of acute intracranial abnormality. 2. Generalized cerebral volume loss and moderate chronic small vessel ischemic changes in the cerebral white matter. Electronically Signed   By: Delbert Phenix M.D.   On: 10/16/2019 13:44   DG CHEST PORT 1 VIEW  Result Date: 10/16/2019 CLINICAL DATA:  Pneumonia.  Hypoxia. EXAM: PORTABLE CHEST 1 VIEW COMPARISON:  Chest radiograph 10/09/2019. FINDINGS: Low lung volumes. Patchy opacity at the left lung base. Unchanged heart size and mediastinal contours. Aortic atherosclerosis. No pulmonary edema, large pleural effusion or pneumothorax. Stable osseous structures. IMPRESSION: Patchy left lung base opacity, may represent atelectasis or pneumonia. Electronically Signed   By: Narda Rutherford M.D.   On: 10/16/2019 15:53       Amanda Owen Amanda Owen   [facility] If 7PM-7AM, please contact night-coverage at [email or web address], Office  [phone]   10/17/2019, 11:02 AM  LOS: 1 day

## 2019-10-17 NOTE — Progress Notes (Addendum)
Palliative Medicine Inpatient Follow Up Note   Reason for consult:  Discuss Goals of care, Hospice in setting of decline and failure to thrive.  HPI:  Per intake H&P "Amanda Banksis a 86 y.o.femalewith medical history significant ofhypertension, hyperlipidemia, dementia, depression,CVA,recurrent syncope brought by EMS to emergency department for the concern syncopal episode.  Per EMS: Patient wasLKW at 0745 this a.m when she was up in her wheelchair eating breakfast Alert and talking.staff went and check on her& she was foundslumped over in the wheelchair therefore EMS was called. Upon EMS arrival patient was cold to touch, unresponsive with periodOf apnea.Patient was being bagged by EMS and brought patient to ER for further evaluation and management. Code stroke was called and CT head was obtained which came back negative for stroke. Code stroke was canceled. Patient was evaluated by neurology in ED.  No history of seizure, head trauma, fall, vomiting, fever, urinary or bowel incontinence.  Patient recently admitted at [facility] with syncopal episodes QT interval and electrolyte imbalance. She discharged recently on 6/9."  Today's Discussion (10/17/2019): I have reviewed medical records including EPIC notes, labs and imaging, received report from bedside RN. Patient assessed. Noted to be resting in no acute distress.  I was able to call patients daughter, Amanda Owen. We plan to meet this afternoon at 1300 to discuss hospice in greater detail. _______________________________________________________ 1300: I met with [name] at bedside we discussed her mothers current condition and her "fainting spells" over the years.  Amanda Owen states that they use to happen once or twice a year but the frequency they are happening now had never occurred before. Amanda Owen shares that her goals for her mother would be for her to gain strength. She has been immobilized for the past two  months. We discussed hospice and the values associated with hospice which include dignity and quality at the end of life. We reviewed Amanda Owen's failure to thrive as of recently. Amanda Owen shares that her mothers dentures need to be fix and she cannot masticate the food provided to her. Amanda Owen shares that [name] lives on grits, potatoes, broccoli, and beans. She feels that prior to May her mother had a very good quality of life and would ideally like to give her a change to get back to that. We discussed her having a trial of therapy [facility] and enrolling in hospice thereafter.  We completed a MOST form today. The patient and family outlined their wishes for the following treatment decisions:  Cardiopulmonary Resuscitation: Do Not Attempt Resuscitation (DNR/No CPR)  Medical Interventions: Comfort Measures: Keep clean, warm, and dry. Use medication by any route, positioning, wound care, and other measures to relieve pain and suffering. Use oxygen, suction and manual treatment of airway obstruction as needed for comfort. Do not transfer to the hospital unless comfort needs cannot be met in current location.  Antibiotics: Antibiotics if indicated  IV Fluids: IV fluids if indicated  Feeding Tube: No feeding tube   Discussed the importance of continued conversation with family and their  medical providers regarding overall plan of care and treatment options, ensuring decisions are within the context of the patients values and GOCs.  Provided "Hard Choices for [employer]" booklet.   Questions and concerns addressed   Vital Signs Vitals:   10/16/19 2105 10/17/19 0501  BP: (!) 177/70 (!) 160/75  Pulse: 74 79  Resp: 16 16  Temp: 98.2 F (36.8 C) 98.4 F (36.9 C)  SpO2: 97% 95%    Intake/Output Summary (Last 24  hours) at 10/17/2019 0900 Last data filed at 10/17/2019 0600 Gross per 24 hour  Intake 1440.05 ml  Output 250 ml  Net 1190.05 ml   Last Weight  Most recent update: 10/17/2019   2:01 AM   Weight  67 kg (147 lb 11.3 oz)           Physical Exam Constitutional: In no acute distress Neuro: alert, follow simple commands Respiratory: respirations nonlabored, BLBS = clear CV: S1S2 RRR, no edema, PPP x 4 extremities 2+ Abdomen: round, soft, nontender, no masses, bowel sounds normoactive MS: SAME Skin: dressing in place right ankle medial aspect  SUMMARY OF RECOMMENDATIONS [facility] medical treatment DNAR/DNI MOST completed and placed on the chart DNR completed and placed on the chart TOC --> Hospice infomation Dispo: Daughter is interested in rehabilitation and enrollment in hospice thereafter  Time In: 1300 Time Out: 1400 Time Spent: 60 Greater than 50% of the time was spent in counseling and coordination of care ______________________________________________________________________________________ [place] Team Team Cell Phone: [phone] Please utilize secure chat with additional questions, if there is no response within 30 minutes please call the above phone number  Palliative Medicine Team providers are available by phone from 7am to 7pm daily and can be reached through the team cell phone.  Should this patient require assistance outside of these hours, please call the patient's attending physician.

## 2019-10-17 NOTE — Evaluation (Addendum)
Occupational Therapy Evaluation Patient Details Name: Amanda Owen MRN: [id number] DOB: 09/10/1922 Today's Date: 10/17/2019    History of Present Illness Amanda Owen is a 86 y.o. female with medical history significant of hypertension, hyperlipidemia, dementia, depression, CVA,  recurrent syncope brought by EMS to emergency department for the concern for recurrent syncopal episodes; recent admissions   Clinical Impression   This 86 y/o female presents with the above. PTA pt residing in ALF facility, suspect requiring assist for ADL/mobility tasks. Pt pleasantly confused this session and agreeable to working with therapies. Pt tolerating sitting EOB for short period of time (progressing to minguard assist for sitting balance) and tolerating OOB to recliner with two person assist for completion of functional transfers. Pt able to perform simple grooming ADL (facewashing) with setup/supervision while in supported sitting - suspect requires max-totalA for further ADL tasks including LB and toileting ADL. Pt denies dizziness with activity today. She will benefit from continued acute OT services, currently recommend pt return to her previous facility (pending they are able to provide necessary hands on 24hr assist) for additional OT services. Will follow.     Follow Up Recommendations  Supervision/Assistance - 24 hour;SNF;Other (comment) (return to previous facility with OT services vs SNF w/ OT )    Equipment Recommendations  None recommended by OT           Precautions / Restrictions Precautions Precautions: Fall Restrictions Weight Bearing Restrictions: No      Mobility Bed Mobility Overal bed mobility: Needs Assistance Bed Mobility: Supine to Sit     Supine to sit: Max assist;+2 for physical assistance     General bed mobility comments: multimodal cues with use of bedpad to assist with scooting hips, hand over hand assist provided to aide in trunk elevation   Transfers Overall  transfer level: Needs assistance Equipment used: 2 person hand held assist Transfers: Squat Pivot Transfers     Squat pivot transfers: Max assist;+2 physical assistance     General transfer comment: pt tolerating OOB to chair rather well today with two person assist via face to face method - pt initiates through LEs to assist with initial boost from EOB    Balance Overall balance assessment: Needs assistance Sitting-balance support: Feet supported Sitting balance-Leahy Scale: Fair Sitting balance - Comments: external assist initially progressed to minguard assist for static balacne    Standing balance support: Bilateral upper extremity supported Standing balance-Leahy Scale: Zero Standing balance comment: +2 maxA for standing support                            ADL either performed or assessed with clinical judgement   ADL Overall ADL's : Needs assistance/impaired     Grooming: Wash/dry face;Set up;Supervision/safety;Sitting Grooming Details (indicate cue type and reason): in supported sitting, supervision for safety                             Functional mobility during ADLs: Maximal assistance;+2 for physical assistance (squat pivot) General ADL Comments: suspect pt requiring increased assist for additional ADL tasks (max-totalA) given baseline cognitive deficits and weakness. pt tolerating OOB to chair today with two person assist                          Pertinent Vitals/Pain Pain Assessment: Faces Faces Pain Scale: Hurts little more Pain Location: bil LEs intermittently (most notably  L knee) with certain movements  Pain Descriptors / Indicators: Discomfort;Grimacing Pain Intervention(s): Monitored during session;Repositioned;Limited activity within patient's tolerance     Hand Dominance Right   Extremity/Trunk Assessment Upper Extremity Assessment Upper Extremity Assessment: Generalized weakness (bruising noted to L hand/wrist)    Lower Extremity Assessment Lower Extremity Assessment: Defer to PT evaluation   Cervical / Trunk Assessment Cervical / Trunk Assessment: Kyphotic   Communication Communication Communication: No difficulties   Cognition Arousal/Alertness: Awake/alert Behavior During Therapy: WFL for tasks assessed/performed Overall Cognitive Status: History of cognitive impairments - at baseline                                 General Comments: pt with hx of dementia, able to answer some simple questions and following simple commands with multimodal cues - states she enjoys bowling    General Comments  HR in the 90s-low 100s with activity     Exercises     Shoulder Instructions      Home Living Family/patient expects to be discharged to:: Assisted living                                 Additional Comments: per chart, pt residing in [facility]       Prior Functioning/Environment Level of Independence: Needs assistance  Gait / Transfers Assistance Needed: per chart pt transferring to w/c, suspect with assist or via hoyer ADL's / Homemaking Assistance Needed: suspect assist with ADL            OT Problem List: Decreased strength;Decreased range of motion;Decreased activity tolerance;Impaired balance (sitting and/or standing);Decreased cognition;Decreased knowledge of use of DME or AE;Decreased knowledge of precautions      OT Treatment/Interventions: Self-care/ADL training;Therapeutic exercise;Energy conservation;DME and/or AE instruction;Therapeutic activities;Patient/family education;Balance training;Cognitive remediation/compensation    OT Goals(Current goals can be found in the care plan section) Acute Rehab OT Goals Patient Stated Goal: pt agreeable to OOB today OT Goal Formulation: With patient Time For Goal Achievement: 10/31/19 Potential to Achieve Goals: Good  OT Frequency: Min 2X/week   Barriers to D/C:            Co-evaluation  PT/OT/SLP Co-Evaluation/Treatment: Yes Reason for Co-Treatment: For patient/therapist safety;To address functional/ADL transfers;Necessary to address cognition/behavior during functional activity   OT goals addressed during session: ADL's and self-care      AM-PAC OT "6 Clicks" Daily Activity     Outcome Measure Help from another person eating meals?: A Lot Help from another person taking care of personal grooming?: A Lot Help from another person toileting, which includes using toliet, bedpan, or urinal?: Total Help from another person bathing (including washing, rinsing, drying)?: A Lot Help from another person to put on and taking off regular upper body clothing?: A Lot Help from another person to put on and taking off regular lower body clothing?: Total 6 Click Score: 10   End of Session Equipment Utilized During Treatment: Gait belt Nurse Communication: Mobility status;Need for lift equipment (maximove pad in chair for return to bed )  Activity Tolerance: Patient tolerated treatment well Patient left: in chair;with call bell/phone within reach;with chair alarm set  OT Visit Diagnosis: Muscle weakness (generalized) (M62.81);Other abnormalities of gait and mobility (R26.89);Other symptoms and signs involving cognitive function                Time: 4944-9675 OT Time Calculation (  min): 28 min Charges:  OT General Charges $OT Visit: 1 Visit OT Evaluation $OT Eval Moderate Complexity: [place], OT Acute Rehabilitation Services Pager [phone] Office [phone]   Raymondo Band 10/17/2019, 9:29 AM

## 2019-10-17 NOTE — Evaluation (Signed)
Physical Therapy Evaluation Patient Details Name: Amanda Owen MRN: [id number] DOB: 12-Aug-1922 Today's Date: 10/17/2019   History of Present Illness  Amanda Owen is a 86 y.o. female with medical history significant of hypertension, hyperlipidemia, dementia, depression, CVA,  recurrent syncope brought by EMS to emergency department for the concern for recurrent syncopal episodes; recent admissions  Clinical Impression   Pt admitted with above diagnosis. Prior to admission, pt residing in ALF facility, suspect requiring assist for ADL/mobility tasks. Pt pleasantly confused this session and agreeable to working with therapies. Pt tolerating sitting EOB for short period of time (progressing to minguard assist for sitting balance) and tolerating OOB to recliner with two person assist for completion of functional transfers. Pt currently with functional limitations due to the deficits listed below (see PT Problem List). Pt will benefit from skilled PT to increase their independence and safety with mobility to allow discharge to the venue listed below.       Follow Up Recommendations SNF;Supervision/Assistance - 24 hour    Equipment Recommendations  Other (comment) (May need to start using mechanical lift for OOB)    Recommendations for Other Services       Precautions / Restrictions Precautions Precautions: Fall Restrictions Weight Bearing Restrictions: No      Mobility  Bed Mobility Overal bed mobility: Needs Assistance Bed Mobility: Supine to Sit     Supine to sit: Max assist;+2 for physical assistance     General bed mobility comments: multimodal cues with use of bedpad to assist with scooting hips, hand over hand assist provided to aide in trunk elevation   Transfers Overall transfer level: Needs assistance Equipment used: 2 person hand held assist Transfers: Squat Pivot Transfers     Squat pivot transfers: Max assist;+2 physical assistance     General transfer comment: pt  tolerating OOB to chair rather well today with two person assist via face to face method - pt initiates through LEs to assist with initial boost from EOB  Ambulation/Gait                Stairs            Wheelchair Mobility    Modified Rankin (Stroke Patients Only)       Balance Overall balance assessment: Needs assistance Sitting-balance support: Feet supported Sitting balance-Leahy Scale: Fair Sitting balance - Comments: external assist initially progressed to minguard assist for static balacne    Standing balance support: Bilateral upper extremity supported Standing balance-Leahy Scale: Zero Standing balance comment: +2 maxA for standing support                              Pertinent Vitals/Pain Pain Assessment: Faces Faces Pain Scale: Hurts little more Pain Location: bil LEs intermittently (most notably L knee) with certain movements  Pain Descriptors / Indicators: Discomfort;Grimacing Pain Intervention(s): Monitored during session;Repositioned;Limited activity within patient's tolerance    Home Living Family/patient expects to be discharged to:: Assisted living                 Additional Comments: per chart, pt residing in [facility]     Prior Function Level of Independence: Needs assistance   Gait / Transfers Assistance Needed: per chart pt transferring to w/c, suspect with assist or via hoyer  ADL's / Homemaking Assistance Needed: suspect assist with ADL        Hand Dominance   Dominant Hand: Right    Extremity/Trunk Assessment  Upper Extremity Assessment Upper Extremity Assessment: Defer to OT evaluation    Lower Extremity Assessment Lower Extremity Assessment: Generalized weakness    Cervical / Trunk Assessment Cervical / Trunk Assessment: Kyphotic  Communication   Communication: No difficulties  Cognition Arousal/Alertness: Awake/alert Behavior During Therapy: WFL for tasks assessed/performed Overall  Cognitive Status: History of cognitive impairments - at baseline                                 General Comments: pt with hx of dementia, able to answer some simple questions and following simple commands with multimodal cues - states she enjoys bowling       General Comments General comments (skin integrity, edema, etc.): HR in the 90s-low 100s with activity     Exercises     Assessment/Plan    PT Assessment Patient needs continued PT services  PT Problem List Decreased activity tolerance;Decreased balance;Decreased mobility;Decreased knowledge of use of DME;Decreased safety awareness;Decreased knowledge of precautions;Pain;Decreased strength       PT Treatment Interventions DME instruction;Gait training;Functional mobility training;Therapeutic activities;Therapeutic exercise;Balance training;Patient/family education    PT Goals (Current goals can be found in the Care Plan section)  Acute Rehab PT Goals Patient Stated Goal: pt agreeable to OOB today PT Goal Formulation: Patient unable to participate in goal setting Time For Goal Achievement: 10/31/19 Potential to Achieve Goals: Fair    Frequency Min 2X/week   Barriers to discharge        Co-evaluation PT/OT/SLP Co-Evaluation/Treatment: Yes Reason for Co-Treatment: For patient/therapist safety;Necessary to address cognition/behavior during functional activity;To address functional/ADL transfers PT goals addressed during session: Mobility/safety with mobility OT goals addressed during session: ADL's and self-care       AM-PAC PT "6 Clicks" Mobility  Outcome Measure Help needed turning from your back to your side while in a flat bed without using bedrails?: A Lot Help needed moving from lying on your back to sitting on the side of a flat bed without using bedrails?: A Lot Help needed moving to and from a bed to a chair (including a wheelchair)?: A Lot Help needed standing up from a chair using your arms  (e.g., wheelchair or bedside chair)?: A Lot Help needed to walk in hospital room?: Total Help needed climbing 3-5 steps with a railing? : Total 6 Click Score: 10    End of Session Equipment Utilized During Treatment: Gait belt (bed pad) Activity Tolerance: Patient tolerated treatment well Patient left: in chair;with call bell/phone within reach;with chair alarm set Nurse Communication: Mobility status;Need for lift equipment (Maximove pad is under pt) PT Visit Diagnosis: Unsteadiness on feet (R26.81);Pain;Muscle weakness (generalized) (M62.81) Pain - Right/Left: Left Pain - part of body: Knee    Time: 7124-5809 PT Time Calculation (min) (ACUTE ONLY): 29 min   Charges:   PT Evaluation $PT Eval Moderate Complexity: 1 Mod          Roney [place], [place]  Acute Rehabilitation Services Pager [phone] Office [phone]   Colletta [place] 10/17/2019, 11:43 AM

## 2019-10-17 NOTE — Evaluation (Signed)
Clinical/Bedside Swallow Evaluation Patient Details  Name: Amanda Owen MRN: [id number] Date of Birth: May 17, 1922  Today's Date: 10/17/2019 Time: SLP Start Time (ACUTE ONLY): 1201 SLP Stop Time (ACUTE ONLY): 1213 SLP Time Calculation (min) (ACUTE ONLY): 12 min  Past Medical History:  Past Medical History:  Diagnosis Date  . Chronic kidney failure   . Depression   . Hyperlipidemia   . Hypertension   . Memory deficit   . Occlusion of carotid artery   . Vitamin deficiency    Past Surgical History:  Past Surgical History:  Procedure Laterality Date  . ABDOMINAL HYSTERECTOMY     HPI:   Amanda Owen is a 86 y.o. female with medical history significant of hypertension, hyperlipidemia, dementia, depression, CVA,  recurrent syncope brought by EMS to emergency department for the concern syncopal episode.  CXR reported: "Patchy left lung base opacity, may represent atelectasis or pneumonia"    Assessment / Plan / Recommendation Clinical Impression  Pt was seen for a bedside swallow evaluation and she presents with an oral and likely cognitive-based dysphagia.  Pt was encountered awake/alert and she was agreeable to this evaluation.  Oral mechanism examination was unremarkable per limited evaluation (pt exhibited difficulty following commands).  She was only seen with minimal trials of ice chips, thin liquid, puree, and soft solids secondary to polite refusal of additional trials.  Mastication of soft solids was mildly prolonged, but it was effective and pt was able to clear trace oral residue with a cued lingual wash.  Suspect delayed swallow initiation with liquid trials, but no overt s/sx of aspiration were observed with any PO trials on this date.  Recommend initiation of Dysphagia 1 (puree) solids and thin liquids with medications administered crushed in puree.  SLP will f/u to monitor diet tolerance.    SLP Visit Diagnosis: Dysphagia, oral phase (R13.11)    Aspiration Risk  Mild aspiration  risk    Diet Recommendation Dysphagia 1 (Puree);Thin liquid   Liquid Administration via: Cup;Straw Medication Administration: Crushed with puree Supervision: Full supervision/cueing for compensatory strategies;Staff to assist with self feeding Compensations: Minimize environmental distractions;Slow rate;Small sips/bites Postural Changes: Seated upright at 90 degrees;Remain upright for at least 30 minutes after po intake    Other  Recommendations Oral Care Recommendations: Oral care BID;Staff/trained caregiver to provide oral care   Follow up Recommendations Skilled Nursing facility;24 hour supervision/assistance      Frequency and Duration min 2x/week  2 weeks       Prognosis Prognosis for Safe Diet Advancement: Fair Barriers to Reach Goals: Cognitive deficits      Swallow Study   General HPI:  Amanda Owen is a 86 y.o. female with medical history significant of hypertension, hyperlipidemia, dementia, depression, CVA,  recurrent syncope brought by EMS to emergency department for the concern syncopal episode.  CXR reported: "Patchy left lung base opacity, may represent atelectasis or Type of Study: Bedside Swallow Evaluation Previous Swallow Assessment: None Diet Prior to this Study: NPO Temperature Spikes Noted: No Respiratory Status: Room air History of Recent Intubation: No Behavior/Cognition: Alert;Pleasant mood Oral Cavity Assessment: Within Functional Limits Oral Care Completed by SLP: No Oral Cavity - Dentition: Dentures, top;Dentures, bottom Self-Feeding Abilities: Needs assist Patient Positioning: Upright in chair Baseline Vocal Quality: Normal Volitional Swallow: Unable to elicit    Oral/Motor/Sensory Function Overall Oral Motor/Sensory Function: Within functional limits (per limited evaluation)   Ice Chips Ice chips: Within functional limits Presentation: Spoon   Thin Liquid Thin Liquid: Within functional limits Presentation: Spoon;Straw  Nectar Thick Nectar  Thick Liquid: Not tested   Honey Thick Honey Thick Liquid: Not tested   Puree Puree: Within functional limits Presentation: Spoon   Solid     Solid: Impaired Presentation: Spoon Oral Phase Impairments: Impaired mastication Oral Phase Functional Implications: Impaired mastication;Prolonged oral transit;Oral residue     Villa Herb., M.S., CCC-SLP Acute Rehabilitation Services Office: [phone]  Shanon Rosser Theus Espin 10/17/2019,12:24 PM

## 2019-10-18 ENCOUNTER — Inpatient Hospital Stay (HOSPITAL_COMMUNITY)
Admit: 2019-10-18 | Discharge: 2019-10-18 | Disposition: A | Discharge status: Home / Self Care | Payer: Medicare Other | Attending: Family Medicine | Admitting: Family Medicine

## 2019-10-18 DIAGNOSIS — Z7189 Other specified counseling: Secondary | ICD-10-CM

## 2019-10-18 DIAGNOSIS — G9340 Encephalopathy, unspecified: Secondary | ICD-10-CM

## 2019-10-18 DIAGNOSIS — Z515 Encounter for palliative care: Secondary | ICD-10-CM

## 2019-10-18 DIAGNOSIS — Z66 Do not resuscitate: Secondary | ICD-10-CM

## 2019-10-18 MED ORDER — CEPHALEXIN 500 MG PO CAPS
500.0000 mg | ORAL_CAPSULE | Freq: Two times a day (BID) | ORAL | Status: DC
  Administered 2019-10-18 – 2019-10-21 (×6): 500 mg via ORAL
  Filled 2019-10-18 (×7): qty 1

## 2019-10-18 MED ORDER — ENOXAPARIN SODIUM 40 MG/0.4ML ~~LOC~~ SOLN
40.0000 mg | SUBCUTANEOUS | Status: DC
  Filled 2019-10-18 (×3): qty 0.4

## 2019-10-18 NOTE — TOC Initial Note (Signed)
Transition of Care [facility]) - Initial/Assessment Note    Patient Details  Name: Amanda Owen MRN: [id number] Date of Birth: Sep 20, 1922  [facility]) CM/SW Contact:    Kirstie Peri, [place] Work Phone Number: 10/18/2019, 10:22 AM  Clinical Narrative:                 MSW Intern was advised that pt's daughter was now interested in pursuing SNF placement, before pt returns to ALF. Pt's daughter noted she would like the pt to remain in [place] and expressed interest in [place] and [place]. She was advised that [place] does not have beds at the moment due to remodel. She agreed for pt to be faxed out. Pt has had bother Pfizer vaccines. FL2 and faxout completed, will follow with bed offers.  Expected Discharge Plan: Skilled Nursing Facility Barriers to Discharge: Continued Medical Work up, SNF Pending bed offer   Patient Goals and CMS Choice Patient states their goals for this hospitalization and ongoing recovery are:: Pt unable to participate in goal setting due to disorientation, daughter agreeable to SNF placement. CMS Medicare.gov Compare Post Acute Care list provided to:: Patient Represenative (must comment) ([name], daughter) Choice offered to / list presented to : Adult Children  Expected Discharge Plan and Services Expected Discharge Plan: [place] In-house Referral: Clinical Social Work     Living arrangements for the past 2 months: [place]                                      Prior Living Arrangements/Services Living arrangements for the past 2 months: [place] Lives with:: Facility Resident Patient language and need for interpreter reviewed:: Yes Do you feel safe going back to the place where you live?: Yes      Need for Family Participation in Patient Care: Yes (Comment) Care giver support system in place?: Yes (comment)   Criminal Activity/Legal Involvement Pertinent to Current  Situation/Hospitalization: No - Comment as needed  Activities of Daily Living Home Assistive Devices/Equipment: Other (Comment) (pt unable to answer) ADL Screening (condition at time of admission) Patient's cognitive ability adequate to safely complete daily activities?: No Is the patient deaf or have difficulty hearing?: No Does the patient have difficulty seeing, even when wearing glasses/contacts?: No Does the patient have difficulty concentrating, remembering, or making decisions?: Yes Patient able to express need for assistance with ADLs?: No Does the patient have difficulty dressing or bathing?: Yes Independently performs ADLs?: No Does the patient have difficulty walking or climbing stairs?: Yes Weakness of Legs: Both Weakness of Arms/Hands: Both  Permission Sought/Granted Permission sought to share information with : Family Supports    Share Information with NAME: [name]     Permission granted to share info w Relationship: daughter  Permission granted to share info w Contact Information: [phone]  Emotional Assessment Appearance:: Other (Comment Required (Unable to Assess) Attitude/Demeanor/Rapport: Unable to Assess Affect (typically observed): Unable to Assess Orientation: : Oriented to Self Alcohol / Substance Use: Not Applicable Psych Involvement: No (comment)  Admission diagnosis:  Syncope and collapse [R55] Syncope [R55] Pneumonia [J18.9] Acute encephalopathy [G93.40] AKI (acute kidney injury) ([place]) [N17.9] Patient Active Problem List   Diagnosis Date Noted  . Palliative care by specialist   . Goals of care, counseling/discussion   . DNR (do not resuscitate)   . Hyperlipidemia   . Depression   . Hypomagnesemia  10/13/2019  . Hypocalcemia 10/13/2019  . Recurrent syncope 10/09/2019  . AKI (acute kidney injury) (HCC) 10/09/2019  . History of completed stroke 10/09/2019  . Dementia (H[facility]) 10/09/2019  . Elevated troponin 10/09/2019  . Hypokalemia  10/09/2019  . Bacteriuria 10/09/2019  . HTN (hypertension)   . Acute cystitis without hematuria   . Syncope   . Chronic cough 07/31/2010   PCP:  Charlesetta Shanks, MD Pharmacy:   [id number] - [place], [place] - [address] [place] [address] Phone: [phone] Fax: [phone]     Social Determinants of Health (SDOH) Interventions    Readmission Risk Interventions No flowsheet data found.

## 2019-10-18 NOTE — Progress Notes (Signed)
Chaplain responded to spiritual care consult to visit with Ms. Helmkamp whose health has declined over the past month.  Patient was in the bed trying to get her hospital gown back on.  Chaplain assisted.  Ms. Muela read Chaplain on the name tag and began talking about her church.  We both agreed how the best part of church for [place] is the music.  Ms. Fauble said her church has a choir and invited me to come one Sunday.  She spoke of her daughter [name] and her son Shon Hale.  Chaplain is available for additional visits as requested.  Vernell Morgans Chaplain Resident

## 2019-10-18 NOTE — Progress Notes (Addendum)
[facility]  PROGRESS NOTE  Amanda Owen OZD:[id number] DOB: 08-23-1922 DOA: 10/16/2019 PCP: Charlesetta Shanks, MD   Brief HPI:   86 year old female with history of hypertension, hyperlipidemia, dementia, depression, CVA, recurrent syncope brought by EMS from facility after she had syncopal episode at the facility.  As per EMS patient was found slumped over in the wheelchair so EMS was called.  Patient was cool to touch, unresponsive with.  Apnea.  She was bagged by EMS and was brought to the ED for further evaluation. Was called, CT head was negative so code stroke was canceled. Neurology and palliative care were consulted.    Subjective   Patient seen and examined, no new complaints.  Pleasantly confused.   Assessment/Plan:     1. Syncope/unresponsiveness-patient has history of recurrent syncopal episodes.  CT head was negative.  Neurology saw the patient and recommended EEG.  Seizure precautions, continued on telemetry.  PT consulted.   2. Acute kidney injury-resolved, patient presented with creatinine of 1.48.  Baseline creatinine of 0.82.  Today creatinine improved to 0.91.   3. UTI-patient had urine culture on 10/09/2019 which grew E. coli, sensitive to cefazolin.  Patient got antibiotics for 3 days only at that time.  Now again presenting with abnormal urine.  Urine culture not obtained at the time of admission.  Will start Keflex 500 mg p.o. twice daily for 5 days.   4. Hypertension-blood pressure is labile, still reasonably well controlled.  Due to recurrent episodes of syncope will avoid tight control of blood pressure.  Continue amlodipine.  Metoprolol is on hold. 5. Hyperlipidemia-continue statin 6. Depression-continue Celexa, trazodone 7. Prolonged QT interval-very minimal QT prolongation, QTC is 490.  Magnesium was 2.1.  Potassium 4.3.  Continue to monitor on telemetry. 8. Goals of care-palliative care consulted for discussion of goals of care.  Patient is DNR.  Final  recommendation is currently pending.    SpO2: (!) 86 %     Lab Results  Component Value Date   SARSCOV2NAA NEGATIVE 10/16/2019   SARSCOV2NAA NEGATIVE 10/13/2019   SARSCOV2NAA NEGATIVE 10/09/2019     CBG: Recent Labs  Lab 10/16/19 1113  GLUCAP 80    CBC: Recent Labs  Lab 10/13/19 1552 10/14/19 0549 10/16/19 1253 10/16/19 1255 10/17/19 0638  WBC 7.3 7.5 8.7  --  7.8  NEUTROABS 5.6  --  6.5  --   --   HGB 12.5 11.1* 12.7 13.3 11.5*  HCT 41.0 34.9* 40.3 39.0 35.6*  MCV 97.2 94.1 94.4  --  93.9  PLT 229 224 228  --  312    Basic Metabolic Panel: Recent Labs  Lab 10/13/19 1552 10/13/19 1552 10/13/19 1920 10/14/19 0549 10/15/19 0516 10/16/19 1253 10/16/19 1255 10/17/19 0638  NA 144  --   --  140  --  139 140 139  K 2.9*  --   --  5.0  --  4.5 4.3 4.2  CL 119*  --   --  111  --  107 108 107  CO2 17*  --   --  22  --  19*  --  21*  GLUCOSE 75  --   --  91  --  110* 104* 86  BUN 20  --   --  22  --  21 20 16   CREATININE 0.67  --   --  0.82  --  1.48* 1.40* 0.91  CALCIUM 5.8*   < > 8.7* 8.4*  8.1*  --  8.7*  --  8.1*  MG 1.5*  --   --  2.5*  --  2.1  --   --   PHOS  --   --   --  1.6* 2.0* 3.4  --   --    < > = values in this interval not displayed.     Liver Function Tests: Recent Labs  Lab 10/13/19 1552 10/16/19 1253 10/17/19 0638  AST 24 29 26   ALT 14 18 20   ALKPHOS 52 75 68  BILITOT 0.7 1.0 0.9  PROT 4.5* 6.4* 5.7*  ALBUMIN 2.0* 2.7* 2.3*        DVT prophylaxis: Lovenox  Code Status: Full code  Family Communication: No family at bedside    Status is: Inpatient  Dispo: The patient is from: Skilled nursing facility              Anticipated d/c is to: Skilled nursing facility              Anticipated d/c date is: 10/19/2019              Patient currently  medically stable for discharge  Barrier to discharge-awaiting  Skilled nursing facility placement  Pressure Injury 10/13/19 Heel Left Deep Tissue Pressure Injury - Purple or  maroon localized area of discolored intact skin or blood-filled blister due to damage of underlying soft tissue from pressure and/or shear. (Active)  10/13/19 2301  Location: Heel  Location Orientation: Left  Staging: Deep Tissue Pressure Injury - Purple or maroon localized area of discolored intact skin or blood-filled blister due to damage of underlying soft tissue from pressure and/or shear.  Wound Description (Comments):   Present on Admission: Yes     Pressure Injury 10/14/19 Foot Left Deep Tissue Pressure Injury - Purple or maroon localized area of discolored intact skin or blood-filled blister due to damage of underlying soft tissue from pressure and/or shear. inner anterior foot (Active)  10/14/19   Location: Foot  Location Orientation: Left  Staging: Deep Tissue Pressure Injury - Purple or maroon localized area of discolored intact skin or blood-filled blister due to damage of underlying soft tissue from pressure and/or shear.  Wound Description (Comments): inner anterior foot  Present on Admission: Yes       Scheduled medications:  . amLODipine  5 mg Oral Daily  . atorvastatin  40 mg Oral Daily  . dipyridamole-aspirin  1 capsule Oral BID  . enoxaparin (LOVENOX) injection  30 mg Subcutaneous Q24H  . pantoprazole  40 mg Oral Daily  . sodium chloride flush  3 mL Intravenous Once    Consultants:  Neurology  Palliative care  Procedures:  None  Antibiotics:   Anti-infectives (From admission, onward)   None       Objective   Vitals:   10/17/19 1716 10/17/19 2041 10/18/19 0445 10/18/19 0928  BP: (!) 147/90 (!) 146/79 (!) 155/67 112/78  Pulse: 81 86 90 100  Resp:  16 17 18   Temp: 98.6 F (37 C) 97.9 F (36.6 C) 98.5 F (36.9 C) 97.6 F (36.4 C)  TempSrc: Oral Oral    SpO2: 98% 98%  (!) 86%  Weight:      Height:        Intake/Output Summary (Last 24 hours) at 10/18/2019 1250 Last data filed at 10/18/2019 0600 Gross per 24 hour  Intake 360 ml  Output  200 ml  Net 160 ml    06/12 1901 - 06/14 0700 In: 1800.1 [P.O.:360; I.V.:1440.1] Out: 525 [Urine:525]  Filed Weights   10/16/19 1654 10/16/19 2105  Weight: 67 kg 67 kg    Physical Examination:    General-appears in no acute distress  Heart-S1-S2, regular, no murmur auscultated  Lungs-clear to auscultation bilaterally, no wheezing or crackles auscultated  Abdomen-soft, nontender, no organomegaly  Extremities-no edema in the lower extremities  Neuro-alert, oriented to self only    Data Reviewed:   Recent Results (from the past 240 hour(s))  Urine culture     Status: Abnormal   Collection Time: 10/09/19 12:13 AM   Specimen: Urine, Catheterized  Result Value Ref Range Status   Specimen Description URINE, CATHETERIZED  Final   Special Requests   Final    NONE Performed at [facility], [address], [place], [place] [address]    Culture >=100,000 COLONIES/mL ESCHERICHIA COLI (A)  Final   Report Status 10/11/2019 FINAL  Final   Organism ID, Bacteria ESCHERICHIA COLI (A)  Final      Susceptibility   Escherichia coli - MIC*    AMPICILLIN <=2 SENSITIVE Sensitive     CEFAZOLIN <=4 SENSITIVE Sensitive     CEFTRIAXONE <=1 SENSITIVE Sensitive     CIPROFLOXACIN <=0.25 SENSITIVE Sensitive     GENTAMICIN <=1 SENSITIVE Sensitive     IMIPENEM <=0.25 SENSITIVE Sensitive     NITROFURANTOIN <=16 SENSITIVE Sensitive     TRIMETH/SULFA <=20 SENSITIVE Sensitive     AMPICILLIN/SULBACTAM <=2 SENSITIVE Sensitive     PIP/TAZO <=4 SENSITIVE Sensitive     * >=100,000 COLONIES/mL ESCHERICHIA COLI  SARS Coronavirus 2 by RT PCR (hospital order, performed in [facility] hospital lab) Nasopharyngeal Nasopharyngeal Swab     Status: None   Collection Time: 10/09/19  7:49 AM   Specimen: Nasopharyngeal Swab  Result Value Ref Range Status   SARS Coronavirus 2 NEGATIVE NEGATIVE Final    Comment: (NOTE) SARS-CoV-2 target nucleic acids are NOT DETECTED. The SARS-CoV-2 RNA is generally  detectable in upper and lower respiratory specimens during the acute phase of infection. The lowest concentration of SARS-CoV-2 viral copies this assay can detect is 250 copies / mL. A negative result does not preclude SARS-CoV-2 infection and should not be used as the sole basis for treatment or other patient management decisions.  A negative result may occur with improper specimen collection / handling, submission of specimen other than nasopharyngeal swab, presence of viral mutation(s) within the areas targeted by this assay, and inadequate number of viral copies (<250 copies / mL). A negative result must be combined with clinical observations, patient history, and epidemiological information. Fact Sheet for Patients:   BoilerBrush.com.cy Fact Sheet for Healthcare Providers: https://pope.com/ This test is not yet approved or cleared  by the [place] FDA and has been authorized for detection and/or diagnosis of SARS-CoV-2 by FDA under an Emergency Use Authorization (EUA).  This EUA will remain in effect (meaning this test can be used) for the duration of the COVID-19 declaration under Section 564(b)(1) of the Act, 21 U.S.C. section 360bbb-3(b)(1), unless the authorization is terminated or revoked sooner. Performed at [facility], [address], [place], [place] [address]   SARS Coronavirus 2 by RT PCR (hospital order, performed in [facility] hospital lab) Nasopharyngeal Nasopharyngeal Swab     Status: None   Collection Time: 10/13/19  6:15 PM   Specimen: Nasopharyngeal Swab  Result Value Ref Range Status   SARS Coronavirus 2 NEGATIVE NEGATIVE Final    Comment: (NOTE) SARS-CoV-2 target nucleic acids are NOT DETECTED. The  SARS-CoV-2 RNA is generally detectable in upper and lower respiratory specimens during the acute phase of infection. The lowest concentration of SARS-CoV-2 viral copies this assay can detect is  250 copies / mL. A negative result does not preclude SARS-CoV-2 infection and should not be used as the sole basis for treatment or other patient management decisions.  A negative result may occur with improper specimen collection / handling, submission of specimen other than nasopharyngeal swab, presence of viral mutation(s) within the areas targeted by this assay, and inadequate number of viral copies (<250 copies / mL). A negative result must be combined with clinical observations, patient history, and epidemiological information. Fact Sheet for Patients:   BoilerBrush.com.cyhttps://www.fda.gov/media/136312/download Fact Sheet for Healthcare Providers: https://pope.com/https://www.fda.gov/media/136313/download This test is not yet approved or cleared  by the [place] FDA and has been authorized for detection and/or diagnosis of SARS-CoV-2 by FDA under an Emergency Use Authorization (EUA).  This EUA will remain in effect (meaning this test can be used) for the duration of the COVID-19 declaration under Section 564(b)(1) of the Act, 21 U.S.C. section 360bbb-3(b)(1), unless the authorization is terminated or revoked sooner. Performed at [facility], [address] [place], [place] [address]   MRSA PCR Screening     Status: None   Collection Time: 10/14/19  5:30 AM   Specimen: Nasal Mucosa; Nasopharyngeal  Result Value Ref Range Status   MRSA by PCR NEGATIVE NEGATIVE Final    Comment:        The GeneXpert MRSA Assay (FDA approved for NASAL specimens only), is one component of a comprehensive MRSA colonization surveillance program. It is not intended to diagnose MRSA infection nor to guide or monitor treatment for MRSA infections. Performed at [facility], [address] [place], [place] [address]   SARS Coronavirus 2 by RT PCR (hospital order, performed in [facility] hospital lab) Nasopharyngeal Nasopharyngeal Swab     Status: None   Collection Time: 10/16/19   2:23 PM   Specimen: Nasopharyngeal Swab  Result Value Ref Range Status   SARS Coronavirus 2 NEGATIVE NEGATIVE Final    Comment: (NOTE) SARS-CoV-2 target nucleic acids are NOT DETECTED.  The SARS-CoV-2 RNA is generally detectable in upper and lower respiratory specimens during the acute phase of infection. The lowest concentration of SARS-CoV-2 viral copies this assay can detect is 250 copies / mL. A negative result does not preclude SARS-CoV-2 infection and should not be used as the sole basis for treatment or other patient management decisions.  A negative result may occur with improper specimen collection / handling, submission of specimen other than nasopharyngeal swab, presence of viral mutation(s) within the areas targeted by this assay, and inadequate number of viral copies (<250 copies / mL). A negative result must be combined with clinical observations, patient history, and epidemiological information.  Fact Sheet for Patients:   BoilerBrush.com.cyhttps://www.fda.gov/media/136312/download  Fact Sheet for Healthcare Providers: https://pope.com/https://www.fda.gov/media/136313/download  This test is not yet approved or  cleared by the [place] FDA and has been authorized for detection and/or diagnosis of SARS-CoV-2 by FDA under an Emergency Use Authorization (EUA).  This EUA will remain in effect (meaning this test can be used) for the duration of the COVID-19 declaration under Section 564(b)(1) of the Act, 21 U.S.C. section 360bbb-3(b)(1), unless the authorization is terminated or revoked sooner.  Performed at [facility], [address] [place], [place] [address]      Studies:  CT HEAD WO CONTRAST  Result Date:  10/16/2019 CLINICAL DATA:  Hypoxia. Unresponsive. Possible CVA. No reported injury. EXAM: CT HEAD WITHOUT CONTRAST TECHNIQUE: Contiguous axial images were obtained from the base of the skull through the vertex without intravenous contrast. COMPARISON:  10/09/2019 head CT. FINDINGS:  Brain: No evidence of parenchymal hemorrhage or extra-axial fluid collection. No mass lesion, mass effect, or midline shift. No CT evidence of acute infarction. Generalized cerebral volume loss. Nonspecific moderate subcortical and periventricular white matter hypodensity, most in keeping with chronic small vessel ischemic change. Cerebral ventricle sizes are stable and concordant with the degree of cerebral volume loss. Vascular: No acute abnormality. Skull: No evidence of calvarial fracture. Osteitis frontalis interna. Sinuses/Orbits: The visualized paranasal sinuses are essentially clear. Other:  The mastoid air cells are unopacified. IMPRESSION: 1. No evidence of acute intracranial abnormality. 2. Generalized cerebral volume loss and moderate chronic small vessel ischemic changes in the cerebral white matter. Electronically Signed   By: Ilona Sorrel M.D.   On: 10/16/2019 13:44   DG CHEST PORT 1 VIEW  Result Date: 10/16/2019 CLINICAL DATA:  Pneumonia.  Hypoxia. EXAM: PORTABLE CHEST 1 VIEW COMPARISON:  Chest radiograph 10/09/2019. FINDINGS: Low lung volumes. Patchy opacity at the left lung base. Unchanged heart size and mediastinal contours. Aortic atherosclerosis. No pulmonary edema, large pleural effusion or pneumothorax. Stable osseous structures. IMPRESSION: Patchy left lung base opacity, may represent atelectasis or pneumonia. Electronically Signed   By: Keith Rake M.D.   On: 10/16/2019 15:53       [place]   [facility] If 7PM-7AM, please contact night-coverage at [email or web address], Office  [phone]   10/18/2019, 12:50 PM  LOS: 2 days

## 2019-10-18 NOTE — Progress Notes (Signed)
Daily Progress Note   Patient Name: Amanda Owen       Date: 10/18/2019 DOB: 1922-12-24  Age: 86 y.o. MRN#: [id number] Attending Physician: Oswald Hillock, MD Primary Care Physician: De Nurse, MD Admit Date: 10/16/2019  Reason for Consultation/Follow-up: Establishing goals of care  Subjective: Patient in bed- does not answer questions appropriately.  Called daughter to followup on conversation that was had with palliative provider yesterday. Daughter asked for update on patient today.  ROS  Length of Stay: 2  Current Medications: Scheduled Meds:  . amLODipine  5 mg Oral Daily  . atorvastatin  40 mg Oral Daily  . cephALEXin  500 mg Oral Q12H  . dipyridamole-aspirin  1 capsule Oral BID  . enoxaparin (LOVENOX) injection  40 mg Subcutaneous Q24H  . pantoprazole  40 mg Oral Daily  . sodium chloride flush  3 mL Intravenous Once    Continuous Infusions: . sodium chloride 100 mL/hr at 10/17/19 0130    PRN Meds: acetaminophen **OR** acetaminophen, ondansetron **OR** ondansetron (ZOFRAN) IV  Physical Exam Vitals and nursing note reviewed.  Constitutional:      Comments: Frail  Neurological:     Mental Status: She is disoriented.  Psychiatric:     Comments: Pleasantly confused              Vital Signs: BP 112/78 (BP Location: Left Arm)   Pulse 100   Temp 97.6 F (36.4 C)   Resp 18   Ht 5\' 4"  (1.626 m)   Wt 67 kg   SpO2 91%   BMI 25.35 kg/m  SpO2: SpO2: 91 % O2 Device: O2 Device: Room Air O2 Flow Rate:    Intake/output summary:   Intake/Output Summary (Last 24 hours) at 10/18/2019 1653 Last data filed at 10/18/2019 0600 Gross per 24 hour  Intake 240 ml  Output 200 ml  Net 40 ml   LBM: Last BM Date: 10/14/19 Baseline Weight: Weight: 67 kg Most recent weight:  Weight: 67 kg       Palliative Assessment/Data: PPS: 20%      Patient Active Problem List   Diagnosis Date Noted  . Palliative care by specialist   . Goals of care, counseling/discussion   . DNR (do not resuscitate)   . Hyperlipidemia   . Depression   . Hypomagnesemia 10/13/2019  .  Hypocalcemia 10/13/2019  . Recurrent syncope 10/09/2019  . AKI (acute kidney injury) (HCC) 10/09/2019  . History of completed stroke 10/09/2019  . Dementia (HCC) 10/09/2019  . Elevated troponin 10/09/2019  . Hypokalemia 10/09/2019  . Bacteriuria 10/09/2019  . HTN (hypertension)   . Acute cystitis without hematuria   . Syncope   . Chronic cough 07/31/2010    Palliative Care Assessment & Plan   Patient Profile: Amanda Owen a 86 y.o.femalewith medical history significant ofhypertension, hyperlipidemia, dementia, depression,CVA,recurrent syncope brought by EMS to emergency department for the concern syncopal episode.  Per EMS: Patient wasLKW at 0745 this a.m when she was up in her wheelchair eating breakfast Alert and talking.staff went and check on her&she was foundslumped over in the wheelchair therefore EMS was called. Upon EMS arrival patient was cold to touch, unresponsive with periodOf apnea.Patient was being bagged by EMS and brought patient to ER for further evaluation and management. Code stroke was called and CT head was obtained which came back negative for stroke. Code stroke was canceled. Patient was evaluated by neurology in ED.  No history of seizure, head trauma, fall, vomiting, fever, urinary or bowel incontinence.  Patient recently admitted at [facility] with syncopal episodes QT interval and electrolyte imbalance. She discharged recently on 6/9.   Assessment/Recommendations/Plan   Continue current scope of care  Palliative to follow at facility- please see MOST form on file  Goals of Care and Additional Recommendations:  Limitations on Scope of  Treatment: Full Scope Treatment  Code Status:  DNR  Prognosis:   Unable to determine  Discharge Planning:  Skilled Nursing Facility for rehab with Palliative care service follow-up  Care plan was discussed with patient's daughter- Amanda Owen.  Thank you for allowing the Palliative Medicine Team to assist in the care of this patient.   Time In: 1330 Time Out: 1355 Total Time 25 mins Prolonged Time Billed no      Greater than 50%  of this time was spent counseling and coordinating care related to the above assessment and plan.  Ocie Bob, AGNP-C Palliative Medicine   Please contact Palliative Medicine Team phone at [phone] for questions and concerns.

## 2019-10-18 NOTE — Procedures (Signed)
Patient Name: Amanda Owen  MRN: [id number]  Epilepsy Attending: Charlsie Quest  Referring Physician/Provider: Dr Ardean Larsen Date: 10/18/2019 Duration: 24.27 mins  Patient history: 86yo F with recurrent syncope. EEG to evaluate for seizure.   Level of alertness: Awake  AEDs during EEG study: None  Technical aspects: This EEG study was done with scalp electrodes positioned according to the 10-20 International system of electrode placement. Electrical activity was acquired at a sampling rate of 500Hz  and reviewed with a high frequency filter of 70Hz  and a low frequency filter of 1Hz . EEG data were recorded continuously and digitally stored.   Description: The posterior dominant rhythm consists of 9 Hz activity of moderate voltage (25-35 uV) seen predominantly in posterior head regions, symmetric and reactive to eye opening and eye closing. Hyperventilation and photic stimulation were not performed.     IMPRESSION: This study is within normal limits. No seizures or epileptiform discharges were seen throughout the recording.  [name] 

## 2019-10-18 NOTE — Progress Notes (Signed)
EEG complete - results pending 

## 2019-10-18 NOTE — NC FL2 (Signed)
[place] MEDICAID FL2 LEVEL OF CARE SCREENING TOOL     IDENTIFICATION  Patient Name: Goldie Tregoning Birthdate: 1922/10/22 Sex: female Admission Date (Current Location): 10/16/2019  [facility] and [place] Number:  Producer, television/film/video and Address:  [facility], [address], [place], [place] [address]      Provider Number: [id number]  Attending Physician Name and Address:  Meredeth Ide, MD  Relative Name and Phone Number:  Avonna Iribe, daughter, [phone]    Current Level of Care: Hospital Recommended Level of Care: Skilled Nursing Facility Prior Approval Number:    Date Approved/Denied:   PASRR Number: [id number]  Discharge Plan: SNF    Current Diagnoses: Patient Active Problem List   Diagnosis Date Noted  . Palliative care by specialist   . Goals of care, counseling/discussion   . DNR (do not resuscitate)   . Hyperlipidemia   . Depression   . Hypomagnesemia 10/13/2019  . Hypocalcemia 10/13/2019  . Recurrent syncope 10/09/2019  . AKI (acute kidney injury) (HCC) 10/09/2019  . History of completed stroke 10/09/2019  . Dementia (HCC) 10/09/2019  . Elevated troponin 10/09/2019  . Hypokalemia 10/09/2019  . Bacteriuria 10/09/2019  . HTN (hypertension)   . Acute cystitis without hematuria   . Syncope   . Chronic cough 07/31/2010    Orientation RESPIRATION BLADDER Height & Weight     Self  Normal External catheter Weight: 147 lb 11.3 oz (67 kg) Height:  5\' 4"  (162.6 cm)  BEHAVIORAL SYMPTOMS/MOOD NEUROLOGICAL BOWEL NUTRITION STATUS      Incontinent Diet (See discharge summary.)  AMBULATORY STATUS COMMUNICATION OF NEEDS Skin   Extensive Assist Verbally PU Stage and Appropriate Care (PI Left foot and heel. foam dressing)                       Personal Care Assistance Level of Assistance  Bathing, Feeding, Dressing Bathing Assistance: Maximum assistance Feeding assistance: Maximum assistance Dressing Assistance: Maximum  assistance     Functional Limitations Info  Sight, Hearing, Speech Sight Info: Impaired Hearing Info: Impaired Speech Info: Impaired    SPECIAL CARE FACTORS FREQUENCY  PT (By licensed PT), OT (By licensed OT)     PT Frequency: 5x week OT Frequency: 5x week            Contractures Contractures Info: Not present    Additional Factors Info  Code Status, Allergies Code Status Info: DNR Allergies Info: Ace Inhibitors           Current Medications (10/18/2019):  This is the current hospital active medication list Current Facility-Administered Medications  Medication Dose Route Frequency Provider Last Rate Last Admin  . 0.9 %  sodium chloride infusion   Intravenous Continuous Pahwani, Rinka R, MD 100 mL/hr at 10/17/19 0130 New Bag at 10/17/19 0130  . acetaminophen (TYLENOL) tablet 650 mg  650 mg Oral Q6H PRN Pahwani, Rinka R, MD       Or  . acetaminophen (TYLENOL) suppository 650 mg  650 mg Rectal Q6H PRN Pahwani, Rinka R, MD      . amLODipine (NORVASC) tablet 5 mg  5 mg Oral Daily Pahwani, Rinka R, MD   5 mg at 10/18/19 0957  . atorvastatin (LIPITOR) tablet 40 mg  40 mg Oral Daily Pahwani, Rinka R, MD   40 mg at 10/18/19 0957  . dipyridamole-aspirin (AGGRENOX) 200-25 MG per 12 hr capsule 1 capsule  1 capsule Oral BID Pahwani, 10/20/19, MD  1 capsule at 10/18/19 0957  . enoxaparin (LOVENOX) injection 30 mg  30 mg Subcutaneous Q24H Pahwani, Rinka R, MD   30 mg at 10/17/19 1645  . ondansetron (ZOFRAN) tablet 4 mg  4 mg Oral Q6H PRN Pahwani, Rinka R, MD       Or  . ondansetron (ZOFRAN) injection 4 mg  4 mg Intravenous Q6H PRN Pahwani, Rinka R, MD      . pantoprazole (PROTONIX) EC tablet 40 mg  40 mg Oral Daily Pahwani, Rinka R, MD   40 mg at 10/18/19 0957  . sodium chloride flush (NS) 0.9 % injection 3 mL  3 mL Intravenous Once Varney Biles, MD         Discharge Medications: Please see discharge summary for a list of discharge medications.  Relevant Imaging  Results:  Relevant Lab Results:   Additional Information SS# Gibbs, Student-Social Work

## 2019-10-19 NOTE — Progress Notes (Signed)
[facility]  PROGRESS NOTE  Amanda Owen [id number] DOB: 1922/06/18 DOA: 10/16/2019 PCP: De Nurse, MD   Brief HPI:   86 year old female with history of hypertension, hyperlipidemia, dementia, depression, CVA, recurrent syncope brought by EMS from facility after she had syncopal episode at the facility.  As per EMS patient was found slumped over in the wheelchair so EMS was called.  Patient was cool to touch, unresponsive with.  Apnea.  She was bagged by EMS and was brought to the ED for further evaluation. Was called, CT head was negative so code stroke was canceled. Neurology and palliative care were consulted.    Subjective   Patient seen and examined, continues to be pleasantly confused.   Assessment/Plan:     1. Syncope/unresponsiveness-patient has history of recurrent syncopal episodes.  CT head was negative.  Neurology saw the patient and recommended EEG.  EEG was normal.   Metoprolol was discontinued due to low blood pressure. 2. Acute kidney injury-resolved, patient presented with creatinine of 1.48.  Baseline creatinine of 0.82.  Today creatinine improved to 0.91.   3. UTI-patient had urine culture on 10/09/2019 which grew E. coli, sensitive to cefazolin.  Patient got antibiotics for 3 days only at that time.  Now again presenting with abnormal urine.  Urine culture not obtained at the time of admission.  Will start Keflex 500 mg p.o. twice daily for 5 days.  Stop date 10/22/2019. 4. Hypertension-blood pressure is labile, still reasonably well controlled.  Due to recurrent episodes of syncope will avoid tight control of blood pressure.  Continue amlodipine.  Metoprolol is on hold. 5. Hyperlipidemia-continue statin 6. Depression-continue Celexa, trazodone 7. Prolonged QT interval-very minimal QT prolongation, QTC is 490.  Magnesium was 2.1.  Potassium 4.3.  Continue to monitor on telemetry. 8. Goals of care-palliative care consulted for discussion of goals of care.   Patient is DNR.  Plan to go to skilled nursing facility with palliative care follow-up.    SpO2: 98 %     Lab Results  Component Value Date   SARSCOV2NAA NEGATIVE 10/16/2019   [place] NEGATIVE 10/13/2019   Stout NEGATIVE 10/09/2019     CBG: Recent Labs  Lab 10/16/19 1113  GLUCAP 80    CBC: Recent Labs  Lab 10/13/19 1552 10/14/19 0549 10/16/19 1253 10/16/19 1255 10/17/19 0638  WBC 7.3 7.5 8.7  --  7.8  NEUTROABS 5.6  --  6.5  --   --   HGB 12.5 11.1* 12.7 13.3 11.5*  HCT 41.0 34.9* 40.3 39.0 35.6*  MCV 97.2 94.1 94.4  --  93.9  PLT 229 224 228  --  099    Basic Metabolic Panel: Recent Labs  Lab 10/13/19 1552 10/13/19 1552 10/13/19 1920 10/14/19 0549 10/15/19 0516 10/16/19 1253 10/16/19 1255 10/17/19 0638  NA 144  --   --  140  --  139 140 139  K 2.9*  --   --  5.0  --  4.5 4.3 4.2  CL 119*  --   --  111  --  107 108 107  CO2 17*  --   --  22  --  19*  --  21*  GLUCOSE 75  --   --  91  --  110* 104* 86  BUN 20  --   --  22  --  21 20 16   CREATININE 0.67  --   --  0.82  --  1.48* 1.40* 0.91  CALCIUM 5.8*   < > 8.7* 8.4*  8.1*  --  8.7*  --  8.1*  MG 1.5*  --   --  2.5*  --  2.1  --   --   PHOS  --   --   --  1.6* 2.0* 3.4  --   --    < > = values in this interval not displayed.     Liver Function Tests: Recent Labs  Lab 10/13/19 1552 10/16/19 1253 10/17/19 0638  AST 24 29 26   ALT 14 18 20   ALKPHOS 52 75 68  BILITOT 0.7 1.0 0.9  PROT 4.5* 6.4* 5.7*  ALBUMIN 2.0* 2.7* 2.3*        DVT prophylaxis: Lovenox  Code Status: Full code  Family Communication: No family at bedside    Status is: Inpatient  Dispo: The patient is from: Skilled nursing facility              Anticipated d/c is to: Skilled nursing facility              Anticipated d/c date is: 10/20/2019              Patient currently  medically stable for discharge  Barrier to discharge-awaiting  Skilled nursing facility placement  Pressure Injury 10/13/19 Heel  Left Deep Tissue Pressure Injury - Purple or maroon localized area of discolored intact skin or blood-filled blister due to damage of underlying soft tissue from pressure and/or shear. (Active)  10/13/19 2301  Location: Heel  Location Orientation: Left  Staging: Deep Tissue Pressure Injury - Purple or maroon localized area of discolored intact skin or blood-filled blister due to damage of underlying soft tissue from pressure and/or shear.  Wound Description (Comments):   Present on Admission: Yes     Pressure Injury 10/14/19 Foot Left Deep Tissue Pressure Injury - Purple or maroon localized area of discolored intact skin or blood-filled blister due to damage of underlying soft tissue from pressure and/or shear. inner anterior foot (Active)  10/14/19   Location: Foot  Location Orientation: Left  Staging: Deep Tissue Pressure Injury - Purple or maroon localized area of discolored intact skin or blood-filled blister due to damage of underlying soft tissue from pressure and/or shear.  Wound Description (Comments): inner anterior foot  Present on Admission: Yes       Scheduled medications:  . amLODipine  5 mg Oral Daily  . atorvastatin  40 mg Oral Daily  . cephALEXin  500 mg Oral Q12H  . dipyridamole-aspirin  1 capsule Oral BID  . enoxaparin (LOVENOX) injection  40 mg Subcutaneous Q24H  . pantoprazole  40 mg Oral Daily  . sodium chloride flush  3 mL Intravenous Once    Consultants:  Neurology  Palliative care  Procedures:  None  Antibiotics:   Anti-infectives (From admission, onward)   Start     Dose/Rate Route Frequency Ordered Stop   10/18/19 1300  cephALEXin (KEFLEX) capsule 500 mg     Discontinue     500 mg Oral Every 12 hours 10/18/19 1257 10/23/19 0959       Objective   Vitals:   10/18/19 2050 10/19/19 0459 10/19/19 0500 10/19/19 0851  BP: (!) 144/80 (!) 151/52  (!) 145/69  Pulse: (!) 46 87  (!) 105  Resp:  13  18  Temp: 99.3 F (37.4 C) 98.7 F (37.1 C)   99.1 F (37.3 C)  TempSrc: Oral Axillary  Oral  SpO2: 95% 91%  98%  Weight:   59 kg   Height:  Intake/Output Summary (Last 24 hours) at 10/19/2019 1152 Last data filed at 10/19/2019 0900 Gross per 24 hour  Intake 180 ml  Output 0 ml  Net 180 ml    06/13 1901 - 06/15 0700 In: 540 [P.O.:540] Out: 200 [Urine:200]  Filed Weights   10/16/19 1654 10/16/19 2105 10/19/19 0500  Weight: 67 kg 67 kg 59 kg    Physical Examination:   General-appears in no acute distress Heart-S1-S2, regular, no murmur auscultated Lungs-clear to auscultation bilaterally, no wheezing or crackles auscultated Abdomen-soft, nontender, no organomegaly Extremities-no edema in the lower extremities Neuro-alert, oriented to self only.   Data Reviewed:   Recent Results (from the past 240 hour(s))  SARS Coronavirus 2 by RT PCR (hospital order, performed in [facility] hospital lab) Nasopharyngeal Nasopharyngeal Swab     Status: None   Collection Time: 10/13/19  6:15 PM   Specimen: Nasopharyngeal Swab  Result Value Ref Range Status   SARS Coronavirus 2 NEGATIVE NEGATIVE Final    Comment: (NOTE) SARS-CoV-2 target nucleic acids are NOT DETECTED. The SARS-CoV-2 RNA is generally detectable in upper and lower respiratory specimens during the acute phase of infection. The lowest concentration of SARS-CoV-2 viral copies this assay can detect is 250 copies / mL. A negative result does not preclude SARS-CoV-2 infection and should not be used as the sole basis for treatment or other patient management decisions.  A negative result may occur with improper specimen collection / handling, submission of specimen other than nasopharyngeal swab, presence of viral mutation(s) within the areas targeted by this assay, and inadequate number of viral copies (<250 copies / mL). A negative result must be combined with clinical observations, patient history, and epidemiological information. Fact Sheet for Patients:    BoilerBrush.com.cy Fact Sheet for Healthcare Providers: https://pope.com/ This test is not yet approved or cleared  by the [place] FDA and has been authorized for detection and/or diagnosis of SARS-CoV-2 by FDA under an Emergency Use Authorization (EUA).  This EUA will remain in effect (meaning this test can be used) for the duration of the COVID-19 declaration under Section 564(b)(1) of the Act, 21 U.S.C. section 360bbb-3(b)(1), unless the authorization is terminated or revoked sooner. Performed at [facility], [address], [place], [place] [address]   MRSA PCR Screening     Status: None   Collection Time: 10/14/19  5:30 AM   Specimen: Nasal Mucosa; Nasopharyngeal  Result Value Ref Range Status   MRSA by PCR NEGATIVE NEGATIVE Final    Comment:        The GeneXpert MRSA Assay (FDA approved for NASAL specimens only), is one component of a comprehensive MRSA colonization surveillance program. It is not intended to diagnose MRSA infection nor to guide or monitor treatment for MRSA infections. Performed at [facility], [address], [place], [place] [address]   SARS Coronavirus 2 by RT PCR (hospital order, performed in [facility] hospital lab) Nasopharyngeal Nasopharyngeal Swab     Status: None   Collection Time: 10/16/19  2:23 PM   Specimen: Nasopharyngeal Swab  Result Value Ref Range Status   SARS Coronavirus 2 NEGATIVE NEGATIVE Final    Comment: (NOTE) SARS-CoV-2 target nucleic acids are NOT DETECTED.  The SARS-CoV-2 RNA is generally detectable in upper and lower respiratory specimens during the acute phase of infection. The lowest concentration of SARS-CoV-2 viral copies this assay can detect is 250 copies / mL. A negative result does not preclude SARS-CoV-2 infection and should not be  used as the sole basis for treatment or other patient management decisions.  A negative  result may occur with improper specimen collection / handling, submission of specimen other than nasopharyngeal swab, presence of viral mutation(s) within the areas targeted by this assay, and inadequate number of viral copies (<250 copies / mL). A negative result must be combined with clinical observations, patient history, and epidemiological information.  Fact Sheet for Patients:   BoilerBrush.com.cy  Fact Sheet for Healthcare Providers: https://pope.com/  This test is not yet approved or  cleared by the [place] FDA and has been authorized for detection and/or diagnosis of SARS-CoV-2 by FDA under an Emergency Use Authorization (EUA).  This EUA will remain in effect (meaning this test can be used) for the duration of the COVID-19 declaration under Section 564(b)(1) of the Act, 21 U.S.C. section 360bbb-3(b)(1), unless the authorization is terminated or revoked sooner.  Performed at [facility], [address], [place], [place] [address]      Studies:  EEG adult  Result Date: 10/18/2019 Charlsie Quest, MD     10/18/2019  1:01 PM Patient Name: Amanda Owen MRN: [id number] Epilepsy Attending: Charlsie Quest Referring Physician/Provider: Dr Ardean Larsen Date: 10/18/2019 Duration: 24.27 mins  Patient history: 86yo F with recurrent syncope. EEG to evaluate for seizure.  Level of alertness: Awake  AEDs during EEG study: None  Technical aspects: This EEG study was done with scalp electrodes positioned according to the 10-20 International system of electrode placement. Electrical activity was acquired at a sampling rate of 500Hz  and reviewed with a high frequency filter of 70Hz  and a low frequency filter of 1Hz . EEG data were recorded continuously and digitally stored.  Description: The posterior dominant rhythm consists of 9 Hz activity of moderate voltage (25-35 uV) seen predominantly in posterior head regions, symmetric and  reactive to eye opening and eye closing. Hyperventilation and photic stimulation were not performed.    IMPRESSION: This study is within normal limits. No seizures or epileptiform discharges were seen throughout the recording.  Priyanka   [facility] If 7PM-7AM, please contact night-coverage at [email or web address], Office  [phone]   10/19/2019, 11:52 AM  LOS: 3 days

## 2019-10-19 NOTE — Plan of Care (Signed)
EEG Normal. No need for AED OP Neurology f/u in 4-6 weeks  -- Milon Dikes, MD [facility] Pager: [phone] If 7pm to 7am, please call on call as listed on AMION.

## 2019-10-19 NOTE — Progress Notes (Signed)
  Speech Language Pathology Treatment: Dysphagia  Patient Details Name: Amanda Owen MRN: [id number] DOB: 1922-09-21 Today's Date: 10/19/2019 Time: 6468-0321 SLP Time Calculation (min) (ACUTE ONLY): 11 min  Assessment / Plan / Recommendation Clinical Impression  Pt was lethargic, requiring cues for alertness for PO trials. She had noted residue on chin from obvious anterior spillage, assuming with lunch. No oral residue was seen. She was trialed with purees given two bites of pudding. Initial bite resulted in prolonged oral holding, then cleared with assisted cup sip of water. Second trial resulted in anterior spillage (on same side as prior residue-R). Pt was given another sip of thin water. She has no s/s aspiration and deficits appear to be largely related to mentation/alertness. Unable to trial solids this date due to severity of oral deficits. Pt should continue thin liquids and Dysphagia 1 solids (puree) with TOTAL ASSIST for feeding. Assist should include ensuring a swallow has occurred prior to giving another bite, alternating solids/liquids, oral care at end of meals to ensure oral cavity is clear.   No further ST indicated at this time. Should mentation greatly improve and pt appear appropriate for solid foods, please re-consult ST for re-evaluation.      HPI HPI: Amanda Owen is a 86 y.o. female with medical history significant of hypertension, hyperlipidemia, dementia, depression, CVA,  recurrent syncope brought by EMS to emergency department for the concern syncopal episode.  CXR reported: "Patchy left lung base opacity, may represent atelectasis or pneumonia"        SLP Plan  Discharge SLP treatment due to (comment)       Recommendations  Diet recommendations: Dysphagia 1 (puree);Thin liquid Liquids provided via: Teaspoon;Cup Medication Administration: Crushed with puree Supervision: Full supervision/cueing for compensatory strategies Compensations: Small sips/bites;Slow  rate;Monitor for anterior loss Postural Changes and/or Swallow Maneuvers: Seated upright 90 degrees                Oral Care Recommendations: Oral care QID Follow up Recommendations: Skilled Nursing facility SLP Visit Diagnosis: Dysphagia, oral phase (R13.11) Plan: Discharge SLP treatment due to (comment)          Amanda Owen P. Viann Nielson, M.S., CCC-SLP Speech-Language Pathologist Acute Rehabilitation Services Pager: [phone]   Amanda Owen [name] 10/19/2019, 3:37 PM

## 2019-10-19 NOTE — TOC Progression Note (Signed)
Transition of Care [facility]) - Progression Note    Patient Details  Name: Amanda Owen MRN: [id number] Date of Birth: 30-Sep-1922  [facility] CM/SW Contact  Terrilee Croak, Student-Social Work Phone Number: 10/19/2019, 1:12 PM  Clinical Narrative:    MSW intern provided pt's daughter with bed offers in [facility]. She was unhappy, so pt was faxed out further to [facility]. Pt's daughter chose [facility]. Pt was supposed to discharge today, but facility will not have bed available until Thursday. Pt's daughter had mentioned [facility] a referral was sent out with no response. Dr. Valorie Roosevelt on delay, daughter will be updated about discharge delay. SW will continue to follow.   Expected Discharge Plan: Skilled Nursing Facility Barriers to Discharge: Continued Medical Work up, SNF Pending bed offer  Expected Discharge Plan and Services Expected Discharge Plan: Skilled Nursing Facility In-house Referral: Clinical Social Work     Living arrangements for the past 2 months: Assisted Living Facility                                       Social Determinants of Health (SDOH) Interventions    Readmission Risk Interventions No flowsheet data found.

## 2019-10-20 LAB — SARS CORONAVIRUS 2 (TAT 6-24 HRS): SARS Coronavirus 2: NEGATIVE

## 2019-10-20 MED ORDER — STERILE WATER FOR INJECTION IJ SOLN
INTRAMUSCULAR | Status: AC
  Filled 2019-10-20: qty 10

## 2019-10-20 MED ORDER — FENTANYL CITRATE (PF) 100 MCG/2ML IJ SOLN
INTRAMUSCULAR | Status: AC
  Filled 2019-10-20: qty 2

## 2019-10-20 MED ORDER — ETOMIDATE 2 MG/ML IV SOLN
INTRAVENOUS | Status: AC
  Filled 2019-10-20: qty 20

## 2019-10-20 MED ORDER — MIDAZOLAM HCL 2 MG/2ML IJ SOLN
INTRAMUSCULAR | Status: AC
  Filled 2019-10-20: qty 4

## 2019-10-20 MED ORDER — VECURONIUM BROMIDE 10 MG IV SOLR
INTRAVENOUS | Status: AC
  Filled 2019-10-20: qty 10

## 2019-10-20 MED ORDER — ROCURONIUM BROMIDE 10 MG/ML (PF) SYRINGE
PREFILLED_SYRINGE | INTRAVENOUS | Status: AC
  Filled 2019-10-20: qty 10

## 2019-10-20 MED ORDER — SUCCINYLCHOLINE CHLORIDE 200 MG/10ML IV SOSY
PREFILLED_SYRINGE | INTRAVENOUS | Status: AC
  Filled 2019-10-20: qty 10

## 2019-10-20 MED ORDER — PROPOFOL 10 MG/ML IV BOLUS
INTRAVENOUS | Status: AC
  Filled 2019-10-20: qty 20

## 2019-10-20 NOTE — Progress Notes (Signed)
PROGRESS NOTE    Amanda Owen  [id number] DOB: [birth year] DOA: 10/16/2019 PCP: De Nurse, MD    Brief Narrative:  86 year old female with history of hypertension, hyperlipidemia, dementia, depression, CVA, recurrent syncope brought by EMS from facility after she had syncopal episode at the facility.  As per EMS patient was found slumped over in the wheelchair so EMS was called.  Patient was cool to touch, unresponsive with.  Apnea.  She was bagged by EMS and was brought to the ED for further evaluation. Was called, CT head was negative so code stroke was canceled. Neurology and palliative care were consulted.  Assessment & Plan:   Principal Problem:   Recurrent syncope Active Problems:   AKI (acute kidney injury) ([place])   Dementia ([place])   HTN (hypertension)   Syncope   Hyperlipidemia   Depression   Palliative care by specialist   Goals of care, counseling/discussion   DNR (do not resuscitate)   Advanced care planning/counseling discussion  1. Syncope/unresponsiveness-patient has history of recurrent syncopal episodes.  CT head was negative.  Pt was seen by Neuro and recommended EEG.  EEG was normal.   Metoprolol was discontinued due to low blood pressure. 2. Acute kidney injury-resolved, patient presented with creatinine of 1.48.  Baseline creatinine of 0.82.  Renal function has remained stable  3. UTI-patient had urine culture on 10/09/2019 which grew E. coli, sensitive to cefazolin.  Patient got antibiotics for 3 days only at that time.  Now again presenting with abnormal urine.  Urine culture not obtained at the time of admission.  Now on Keflex 500 mg p.o. twice daily for 5 days.  Stop date 10/22/2019. 4. Hypertension-blood pressure is labile, still reasonably well controlled.  Due to recurrent episodes of syncope will avoid tight control of blood pressure.  Continue amlodipine as tolerated.  Metoprolol is on hold. 5. Hyperlipidemia-continue statin 6. Depression-continue  Celexa, trazodone 7. Prolonged QT interval-very minimal QT prolongation, QTC is 490.  Magnesium was 2.1.  Potassium noted to be 4.2 on last check.  Continue to monitor on telemetry. 8. Goals of care-palliative care consulted for discussion of goals of care.  Patient is DNR.  Plan to go to skilled nursing facility with palliative care follow-up. COVID testing underway for placement  DVT prophylaxis: Lovenox subq Code Status: DNR Family Communication: Pt in room, family not at bedside  Status is: Inpatient  Remains inpatient appropriate because:Unsafe d/c plan   Dispo: The patient is from: SNF              Anticipated d/c is to: SNF              Anticipated d/c date is: 1 day              Patient currently is medically stable to d/c., pending bed availability, anticipated on 6/17   Consultants:   Neurology  Palliative Care  Procedures:     Antimicrobials: Anti-infectives (From admission, onward)   Start     Dose/Rate Route Frequency Ordered Stop   10/18/19 1300  cephALEXin (KEFLEX) capsule 500 mg     Discontinue     500 mg Oral Every 12 hours 10/18/19 1257 10/23/19 0959       Subjective: Without complaints this afternoon  Objective: Vitals:   10/19/19 2038 10/20/19 0508 10/20/19 0915 10/20/19 0935  BP: (!) 174/104 (!) 154/67 (!) 98/58 113/61  Pulse: (!) 58 88  89  Resp: 18 18  16   Temp: 99.5 F (37.5 C)  97.8 F (36.6 C)  97.7 F (36.5 C)  TempSrc: Oral Oral  Oral  SpO2: 98% 96%  97%  Weight:      Height:        Intake/Output Summary (Last 24 hours) at 10/20/2019 1628 Last data filed at 10/20/2019 0900 Gross per 24 hour  Intake 20 ml  Output --  Net 20 ml   Filed Weights   10/16/19 1654 10/16/19 2105 10/19/19 0500  Weight: 67 kg 67 kg 59 kg    Examination:  General exam: Appears calm and comfortable  Respiratory system: Clear to auscultation. Respiratory effort normal. Cardiovascular system: S1 & S2 heard, Regular Gastrointestinal system: Abdomen  is nondistended, soft and nontender. No organomegaly or masses felt. Normal bowel sounds heard. Central nervous system: Alert and oriented. No focal neurological deficits. Extremities: Symmetric 5 x 5 power. Skin: No rashes, lesions Psychiatry: Judgement and insight appear normal. Mood & affect appropriate.   Data Reviewed: I have personally reviewed following labs and imaging studies  CBC: Recent Labs  Lab 10/14/19 0549 10/16/19 1253 10/16/19 1255 10/17/19 0638  WBC 7.5 8.7  --  7.8  NEUTROABS  --  6.5  --   --   HGB 11.1* 12.7 13.3 11.5*  HCT 34.9* 40.3 39.0 35.6*  MCV 94.1 94.4  --  93.9  PLT 224 228  --  312   Basic Metabolic Panel: Recent Labs  Lab 10/13/19 1920 10/14/19 0549 10/15/19 0516 10/16/19 1253 10/16/19 1255 10/17/19 0638  NA  --  140  --  139 140 139  K  --  5.0  --  4.5 4.3 4.2  CL  --  111  --  107 108 107  CO2  --  22  --  19*  --  21*  GLUCOSE  --  91  --  110* 104* 86  BUN  --  22  --  21 20 16   CREATININE  --  0.82  --  1.48* 1.40* 0.91  CALCIUM 8.7* 8.4*  8.1*  --  8.7*  --  8.1*  MG  --  2.5*  --  2.1  --   --   PHOS  --  1.6* 2.0* 3.4  --   --    GFR: Estimated Creatinine Clearance: 32.6 mL/min (by C-G formula based on SCr of 0.91 mg/dL). Liver Function Tests: Recent Labs  Lab 10/16/19 1253 10/17/19 0638  AST 29 26  ALT 18 20  ALKPHOS 75 68  BILITOT 1.0 0.9  PROT 6.4* 5.7*  ALBUMIN 2.7* 2.3*   No results for input(s): LIPASE, AMYLASE in the last 168 hours. No results for input(s): AMMONIA in the last 168 hours. Coagulation Profile: No results for input(s): INR, PROTIME in the last 168 hours. Cardiac Enzymes: No results for input(s): CKTOTAL, CKMB, CKMBINDEX, TROPONINI in the last 168 hours. BNP (last 3 results) No results for input(s): PROBNP in the last 8760 hours. HbA1C: No results for input(s): HGBA1C in the last 72 hours. CBG: Recent Labs  Lab 10/16/19 1113  GLUCAP 80   Lipid Profile: No results for input(s): CHOL,  HDL, LDLCALC, TRIG, CHOLHDL, LDLDIRECT in the last 72 hours. Thyroid Function Tests: No results for input(s): TSH, T4TOTAL, FREET4, T3FREE, THYROIDAB in the last 72 hours. Anemia Panel: No results for input(s): VITAMINB12, FOLATE, FERRITIN, TIBC, IRON, RETICCTPCT in the last 72 hours. Sepsis Labs: No results for input(s): PROCALCITON, LATICACIDVEN in the last 168 hours.  Recent Results (from the past 240 hour(s))  SARS Coronavirus 2 by RT PCR (hospital order, performed in [facility] hospital lab) Nasopharyngeal Nasopharyngeal Swab     Status: None   Collection Time: 10/13/19  6:15 PM   Specimen: Nasopharyngeal Swab  Result Value Ref Range Status   SARS Coronavirus 2 NEGATIVE NEGATIVE Final    Comment: (NOTE) SARS-CoV-2 target nucleic acids are NOT DETECTED. The SARS-CoV-2 RNA is generally detectable in upper and lower respiratory specimens during the acute phase of infection. The lowest concentration of SARS-CoV-2 viral copies this assay can detect is 250 copies / mL. A negative result does not preclude SARS-CoV-2 infection and should not be used as the sole basis for treatment or other patient management decisions.  A negative result may occur with improper specimen collection / handling, submission of specimen other than nasopharyngeal swab, presence of viral mutation(s) within the areas targeted by this assay, and inadequate number of viral copies (<250 copies / mL). A negative result must be combined with clinical observations, patient history, and epidemiological information. Fact Sheet for Patients:   BoilerBrush.com.cy Fact Sheet for Healthcare Providers: https://pope.com/ This test is not yet approved or cleared  by the [place] FDA and has been authorized for detection and/or diagnosis of SARS-CoV-2 by FDA under an Emergency Use Authorization (EUA).  This EUA will remain in effect (meaning this test can be used) for the  duration of the COVID-19 declaration under Section 564(b)(1) of the Act, 21 U.S.C. section 360bbb-3(b)(1), unless the authorization is terminated or revoked sooner. Performed at [facility], [address], [place], [place] [address]   MRSA PCR Screening     Status: None   Collection Time: 10/14/19  5:30 AM   Specimen: Nasal Mucosa; Nasopharyngeal  Result Value Ref Range Status   MRSA by PCR NEGATIVE NEGATIVE Final    Comment:        The GeneXpert MRSA Assay (FDA approved for NASAL specimens only), is one component of a comprehensive MRSA colonization surveillance program. It is not intended to diagnose MRSA infection nor to guide or monitor treatment for MRSA infections. Performed at [facility], [address] [place], [place] [address]   SARS Coronavirus 2 by RT PCR (hospital order, performed in [facility] hospital lab) Nasopharyngeal Nasopharyngeal Swab     Status: None   Collection Time: 10/16/19  2:23 PM   Specimen: Nasopharyngeal Swab  Result Value Ref Range Status   SARS Coronavirus 2 NEGATIVE NEGATIVE Final    Comment: (NOTE) SARS-CoV-2 target nucleic acids are NOT DETECTED.  The SARS-CoV-2 RNA is generally detectable in upper and lower respiratory specimens during the acute phase of infection. The lowest concentration of SARS-CoV-2 viral copies this assay can detect is 250 copies / mL. A negative result does not preclude SARS-CoV-2 infection and should not be used as the sole basis for treatment or other patient management decisions.  A negative result may occur with improper specimen collection / handling, submission of specimen other than nasopharyngeal swab, presence of viral mutation(s) within the areas targeted by this assay, and inadequate number of viral copies (<250 copies / mL). A negative result must be combined with clinical observations, patient history, and epidemiological information.  Fact Sheet for  Patients:   BoilerBrush.com.cy  Fact Sheet for Healthcare Providers: https://pope.com/  This test is not yet approved or  cleared by the [place] FDA and has been authorized for detection and/or diagnosis of SARS-CoV-2 by FDA under an Emergency Use Authorization (EUA).  This EUA  will remain in effect (meaning this test can be used) for the duration of the COVID-19 declaration under Section 564(b)(1) of the Act, 21 U.S.C. section 360bbb-3(b)(1), unless the authorization is terminated or revoked sooner.  Performed at [facility], [address], [place], [place] [address]      Radiology Studies: No results found.  Scheduled Meds: . amLODipine  5 mg Oral Daily  . atorvastatin  40 mg Oral Daily  . cephALEXin  500 mg Oral Q12H  . dipyridamole-aspirin  1 capsule Oral BID  . enoxaparin (LOVENOX) injection  40 mg Subcutaneous Q24H  . pantoprazole  40 mg Oral Daily  . sodium chloride flush  3 mL Intravenous Once   Continuous Infusions: . sodium chloride 100 mL/hr at 10/17/19 0130     LOS: 4 days   Rickey Barbara, MD [facility] Pager On Amion  If 7PM-7AM, please contact night-coverage 10/20/2019, 4:28 PM

## 2019-10-20 NOTE — Progress Notes (Signed)
Physical Therapy Treatment Patient Details Name: Amanda Owen MRN: [id number] DOB: Oct 20, 1922 Today's Date: 10/20/2019    History of Present Illness Amanda Owen is a 86 y.o. female with medical history significant of hypertension, hyperlipidemia, dementia, depression, CVA,  recurrent syncope brought by EMS to emergency department for the concern for recurrent syncopal episodes; recent admissions    PT Comments    Continuing work on functional mobility and activity tolerance;  Notable that Amanda Owen will follow commands wen you are able to connect well with her; We discussed bowling averages much of the session, as well as the music of [place]; Performed a basic pivot transfer OOB to her less painful R side with +2 Max assist; Notable SBP drop with incr time in upright posture; See vitals flow sheet; RN and Nurse Extern aware   Follow Up Recommendations  SNF;Supervision/Assistance - 24 hour     Equipment Recommendations  Other (comment) (May need to start using mechanical lift for OOB)    Recommendations for Other Services       Precautions / Restrictions Precautions Precautions: Fall    Mobility  Bed Mobility Overal bed mobility: Needs Assistance Bed Mobility: Supine to Sit     Supine to sit: Max assist;+2 for physical assistance     General bed mobility comments: multimodal cues with use of bedpad to assist with scooting hips, hand over hand assist provided to aide in trunk elevation; L knee particularly painful today, and so we fully supported her knee in the flexed position as she got up  Transfers Overall transfer level: Needs assistance Equipment used: 2 person hand held assist Transfers: Squat Pivot Transfers     Squat pivot transfers: Max assist;+2 physical assistance     General transfer comment: pt tolerating OOB to chair rather well today with two person assist via face to face method - pt initiates through LEs to assist with initial boost from EOB;  transferred towards he less painful R side  Ambulation/Gait                 Stairs             Wheelchair Mobility    Modified Rankin (Stroke Patients Only)       Balance     Sitting balance-Leahy Scale: Fair       Standing balance-Leahy Scale: Zero Standing balance comment: +2 maxA for standing support                             Cognition Arousal/Alertness: Awake/alert Behavior During Therapy: WFL for tasks assessed/performed Overall Cognitive Status: History of cognitive impairments - at baseline                                 General Comments: pt with hx of dementia, able to answer some simple questions and following simple commands with multimodal cues - states she enjoys bowling       Exercises      General Comments General comments (skin integrity, edema, etc.): Noteworthy that with increased time in upright sitting, Amanda Owen became less interactive, and requested to lay down; after pivot to the chair, noted her BP was 98/58 -- with quite an SBP drop compared to her previous BP taken in supine: 154/67; notified RN, reclined pt and got her feet up; BP 113/61 at end of session, and pt was more  interactive; Ted hose on      Pertinent Vitals/Pain Pain Assessment: Faces Faces Pain Scale: Hurts whole lot Pain Location: bil LEs intermittently (most notably L knee) with certain movements  Pain Descriptors / Indicators: Discomfort;Grimacing Pain Intervention(s): Monitored during session    Home Living                      Prior Function            PT Goals (current goals can now be found in the care plan section) Acute Rehab PT Goals Patient Stated Goal: pt agreeable to OOB today PT Goal Formulation: Patient unable to participate in goal setting Time For Goal Achievement: 10/31/19 Potential to Achieve Goals: Fair Progress towards PT goals: Progressing toward goals (slowly)    Frequency    Min  2X/week      PT Plan Current plan remains appropriate    Co-evaluation              AM-PAC PT "6 Clicks" Mobility   Outcome Measure  Help needed turning from your back to your side while in a flat bed without using bedrails?: A Lot Help needed moving from lying on your back to sitting on the side of a flat bed without using bedrails?: A Lot Help needed moving to and from a bed to a chair (including a wheelchair)?: A Lot Help needed standing up from a chair using your arms (e.g., wheelchair or bedside chair)?: A Lot Help needed to walk in hospital room?: Total Help needed climbing 3-5 steps with a railing? : Total 6 Click Score: 10    End of Session Equipment Utilized During Treatment: Gait belt (bed pad) Activity Tolerance: Patient tolerated treatment well Patient left: in chair;with call bell/phone within reach;with chair alarm set Nurse Communication: Mobility status;Need for lift equipment (Maximove pad is under pt) PT Visit Diagnosis: Unsteadiness on feet (R26.81);Pain;Muscle weakness (generalized) (M62.81) Pain - Right/Left: Left Pain - part of body: Knee     Time: 9417-4081 PT Time Calculation (min) (ACUTE ONLY): 32 min  Charges:  $Therapeutic Activity: 23-37 mins                     Van Clines, PT  Acute Rehabilitation Services Pager [phone] Office [phone]    Levi Aland 10/20/2019, 1:57 PM

## 2019-10-21 MED ORDER — CEPHALEXIN 500 MG PO CAPS: 500.0000 mg | ORAL_CAPSULE | Freq: Two times a day (BID) | ORAL | 0 refills | Status: AC

## 2019-10-21 NOTE — TOC Transition Note (Signed)
Transition of Care (TOC) - CM/SW Discharge Note *Discharged to [facility] via [facility] *Room 123A *Number for Report: [phone]   Patient Details  Name: Amanda Owen MRN: [id number] Date of Birth: 1922/07/01  Transition of Care [facility]) CM/SW Contact:  Cristobal Goldmann, LCSW Phone Number: 10/21/2019, 12:37 PM   Clinical Narrative: Patient medically stable for discharge and [facility] had been chosen. Daughter, Zanovia Rotz [phone]) contacted and informed of today's discharged and confirmed that [facility] was the chosen facility. Facility admissions director, Olegario Messier contacted and informed of patient's readiness for discharge and was provided with patient's room number and number for the nurse to call report. Discharge clinicals transmitted to facility.       Final next level of care: Skilled Nursing Facility [facility]) Barriers to Discharge: Barriers Resolved [facility] chosen)   Patient Goals and CMS Choice Patient states their goals for this hospitalization and ongoing recovery are:: Daughter agreeable to SNF and chose [facility] CMS Medicare.gov Compare Post Acute Care list provided to:: Patient Represenative (must comment) (Daughter [name]) Choice offered to / list presented to : Adult Children  Discharge Placement PASRR number recieved: 10/18/19            Patient [facility] bed at: [facility] Patient to be transferred to facility by: [facility] Name of family member notified: Zeynep Fantroy - [phone] Patient and family notified of of transfer: 10/21/19  Discharge Plan and Services In-house Referral: Clinical Social Work                                   Social Determinants of Health (SDOH) Interventions  No SDOH interventions requested or needed at discharge    Readmission Risk Interventions No flowsheet data found.

## 2019-10-21 NOTE — Discharge Summary (Signed)
Physician Discharge Summary  Amanda Owen TDV:[id number] DOB: 08-26-1922 DOA: 10/16/2019  PCP: Charlesetta Shanks, MD  Admit date: 10/16/2019 Discharge date: 10/21/2019  Admitted From: SNF Disposition:  SNF  Recommendations for Outpatient Follow-up:  1. Follow up with PCP in 1-2 weeks 2. Recommend Palliative Care referral at facility  COVID test on 6/16 is neg  Discharge Condition:Stable CODE STATUS:DNR Diet recommendation: Dysphagia 1 with thin liquids   Brief/Interim Summary: 86 year old female with history of hypertension, hyperlipidemia, dementia, depression, CVA, recurrent syncope brought by EMS from facility after she had syncopal episode at the facility. As per EMS patient was found slumped over in the wheelchair so EMS was called. Patient was cool to touch, unresponsive with. Apnea. She was bagged by EMS and was brought to the ED for further evaluation. Was called, CT head was negative so code stroke was canceled. Neurology and palliative care were consulted.  Discharge Diagnoses:  Principal Problem:   Recurrent syncope Active Problems:   AKI (acute kidney injury) (HCC)   Dementia (HCC)   HTN (hypertension)   Syncope   Hyperlipidemia   Depression   Palliative care by specialist   Goals of care, counseling/discussion   DNR (do not resuscitate)   Advanced care planning/counseling discussion  1. Syncope/unresponsiveness-patient has history of recurrent syncopal episodes. CT head was negative. Pt was seen by Neuro and recommended EEG. EEG was normal. Metoprolol was discontinued due to low blood pressure. 2. Acute kidney injury-resolved, patient presented with creatinine of 1.48. Baseline creatinine of 0.82. Renal function has remained stable 3. UTI-patient had urine culture on 10/09/2019 which grew E. coli, sensitive to cefazolin. Patient got antibiotics for 3 days only at that time. Now again presenting with abnormal urine. Urine culture not obtained at the time of  admission. Now on Keflex 500 mg p.o. twice daily for 5 days. Stop date 10/22/2019. 4. Hypertension-blood pressure is labile, still reasonably well controlled. Due to recurrent episodes of syncope will avoid tight control of blood pressure. Continue amlodipine as tolerated. Metoprolol is on hold. 5. Hyperlipidemia-continue statin 6. Depression-continue Celexa, trazodone 7. Prolonged QT interval-very minimal QT prolongation, QTC is 490. Magnesium was 2.1. Potassium noted to be 4.2 on last check. Continue to monitor on telemetry. 8. Goals of care-palliative care consulted for discussion of goals of care. Patient is DNR. Plan to go to skilled nursing facility with palliative care follow-up. COVID testing neg  Discharge Instructions   Allergies as of 10/21/2019      Reactions   Ace Inhibitors Other (See Comments)   unknown      Medication List    STOP taking these medications   phosphorus [phone] MG tablet Commonly known as: K PHOS NEUTRAL     TAKE these medications   amLODipine 5 MG tablet Commonly known as: NORVASC Take 1 tablet (5 mg total) by mouth daily.   atorvastatin 40 MG tablet Commonly known as: LIPITOR Take 40 mg by mouth daily.   cephALEXin 500 MG capsule Commonly known as: KEFLEX Take 1 capsule (500 mg total) by mouth every 12 (twelve) hours for 2 days.   citalopram 20 MG tablet Commonly known as: CELEXA Take 20 mg by mouth daily.   dipyridamole-aspirin 200-25 MG 12hr capsule Commonly known as: AGGRENOX Take 1 capsule by mouth 2 (two) times daily.   metoprolol tartrate 25 MG tablet Commonly known as: LOPRESSOR Take 25 mg by mouth 2 (two) times daily.   Namenda XR 28 MG Cp24 24 hr capsule Generic drug: memantine Take 28 mg  by mouth daily.   omeprazole 20 MG capsule Commonly known as: PRILOSEC Take 20 mg by mouth daily.   traZODone 50 MG tablet Commonly known as: DESYREL Take 50 mg by mouth at bedtime.       Follow-up Information     Charlesetta Shanks, MD. Schedule an appointment as soon as possible for a visit in 2 week(s).   Specialty: Family Medicine Contact information: [address] [place] [address] [phone]              Allergies  Allergen Reactions  . Ace Inhibitors Other (See Comments)    unknown    Consultations:  Palliative Care  Procedures/Studies: DG Chest 1 View  Result Date: 10/09/2019 CLINICAL DATA:  Syncope EXAM: CHEST  1 VIEW COMPARISON:  08/29/2019 FINDINGS: Lungs are clear.  No pleural effusion or pneumothorax. The heart is normal in size. IMPRESSION: No evidence of acute cardiopulmonary disease. Electronically Signed   By: Charline Bills M.D.   On: 10/09/2019 02:15   CT HEAD WO CONTRAST  Result Date: 10/16/2019 CLINICAL DATA:  Hypoxia. Unresponsive. Possible CVA. No reported injury. EXAM: CT HEAD WITHOUT CONTRAST TECHNIQUE: Contiguous axial images were obtained from the base of the skull through the vertex without intravenous contrast. COMPARISON:  10/09/2019 head CT. FINDINGS: Brain: No evidence of parenchymal hemorrhage or extra-axial fluid collection. No mass lesion, mass effect, or midline shift. No CT evidence of acute infarction. Generalized cerebral volume loss. Nonspecific moderate subcortical and periventricular white matter hypodensity, most in keeping with chronic small vessel ischemic change. Cerebral ventricle sizes are stable and concordant with the degree of cerebral volume loss. Vascular: No acute abnormality. Skull: No evidence of calvarial fracture. Osteitis frontalis interna. Sinuses/Orbits: The visualized paranasal sinuses are essentially clear. Other:  The mastoid air cells are unopacified. IMPRESSION: 1. No evidence of acute intracranial abnormality. 2. Generalized cerebral volume loss and moderate chronic small vessel ischemic changes in the cerebral white matter. Electronically Signed   By: Delbert Phenix M.D.   On: 10/16/2019 13:44   CT Head Wo  Contrast  Result Date: 10/09/2019 CLINICAL DATA:  Encephalopathy. EXAM: CT HEAD WITHOUT CONTRAST TECHNIQUE: Contiguous axial images were obtained from the base of the skull through the vertex without intravenous contrast. COMPARISON:  Head CT 08/29/2019 at [facility] FINDINGS: Brain: No intracranial hemorrhage, mass effect, or midline shift. Stable degree of atrophy and chronic small vessel ischemia. No hydrocephalus. The basilar cisterns are patent. Bilateral basal gangliar mineralization. No evidence of territorial infarct or acute ischemia. Chronic linear calcification in the right frontal lobe. No extra-axial or intracranial fluid collection. Vascular: Atherosclerosis of skullbase vasculature without hyperdense vessel or abnormal calcification. Skull: No fracture or focal lesion. Sinuses/Orbits: Dysconjugate gaze, typically incidental. Left cataract resection. Paranasal sinuses and mastoid air cells are clear. The visualized orbits are unremarkable. Other: None. IMPRESSION: 1. No acute intracranial abnormality. 2. Stable atrophy and chronic small vessel ischemia. Electronically Signed   By: Narda Rutherford M.D.   On: 10/09/2019 01:55   MR BRAIN WO CONTRAST  Result Date: 10/09/2019 CLINICAL DATA:  Encephalopathy. Additional history provided: Dementia, altered mental status, recent fall. EXAM: MRI HEAD WITHOUT CONTRAST TECHNIQUE: Multiplanar, multiecho pulse sequences of the brain and surrounding structures were obtained without intravenous contrast. COMPARISON:  Noncontrast head CT 10/09/2019 FINDINGS: Brain: The patient was unable to tolerate the examination. As a result, only axial and coronal diffusion-weighted imaging, a sagittal T1 weighted sequence, axial T2 weighted sequence and a coronal T2 weighted sequence could  be obtained. The axial diffusion-weighted sequence is of good quality. There is moderate motion degradation of the coronal diffusion-weighted sequence. The remainder of the examination is  severely motion degraded and nondiagnostic. There is a punctate focus of apparent restricted diffusion within the paramedian posterior left frontal lobe which may reflect a tiny acute infarct or artifact (series 3, image 40). Vascular: Poorly assessed due to motion degradation. Skull and upper cervical spine: Poorly assessed due to motion degradation. Sinuses/Orbits: Poorly assessed due to motion degradation. IMPRESSION: Significantly motion degraded, limited and prematurely terminated examination as described. With the exception of the diffusion-weighted imaging, the examination is nondiagnostic. Punctate focus of apparent restricted diffusion within the paramedian posterior left frontal lobe, which may reflect a tiny acute infarct or artifact. Electronically Signed   By: Jackey LogeKyle  Golden DO   On: 10/09/2019 16:21   DG CHEST PORT 1 VIEW  Result Date: 10/16/2019 CLINICAL DATA:  Pneumonia.  Hypoxia. EXAM: PORTABLE CHEST 1 VIEW COMPARISON:  Chest radiograph 10/09/2019. FINDINGS: Low lung volumes. Patchy opacity at the left lung base. Unchanged heart size and mediastinal contours. Aortic atherosclerosis. No pulmonary edema, large pleural effusion or pneumothorax. Stable osseous structures. IMPRESSION: Patchy left lung base opacity, may represent atelectasis or pneumonia. Electronically Signed   By: Narda RutherfordMelanie  Sanford M.D.   On: 10/16/2019 15:53   EEG adult  Result Date: 10/18/2019 Charlsie QuestYadav, Priyanka O, MD     10/18/2019  1:01 PM Patient Name: Amanda Owen MRN: [id number] Epilepsy Attending: Charlsie QuestPriyanka O Yadav Referring Physician/Provider: Dr Ardean Larseninka Pahwani Date: 10/18/2019 Duration: 24.27 mins  Patient history: [age]yo F with recurrent syncope. EEG to evaluate for seizure.  Level of alertness: Awake  AEDs during EEG study: None  Technical aspects: This EEG study was done with scalp electrodes positioned according to the 10-20 International system of electrode placement. Electrical activity was acquired at a sampling rate of  500Hz  and reviewed with a high frequency filter of 70Hz  and a low frequency filter of 1Hz . EEG data were recorded continuously and digitally stored.  Description: The posterior dominant rhythm consists of 9 Hz activity of moderate voltage (25-35 uV) seen predominantly in posterior head regions, symmetric and reactive to eye opening and eye closing. Hyperventilation and photic stimulation were not performed.    IMPRESSION: This study is within normal limits. No seizures or epileptiform discharges were seen throughout the recording.  Charlsie QuestPriyanka O Yadav   EEG adult  Result Date: 10/09/2019 Charlsie QuestYadav, Priyanka O, MD     10/09/2019  4:23 PM Patient Name: Amanda SarasHilda Owen MRN: [id number] Epilepsy Attending: Charlsie QuestPriyanka O Yadav Referring Physician/Provider: Dr Marinda ElkAbraham Feliz-Ortiz Date: 10/09/2019 Duration: 26.06 mins Patient history: 86yo F with recurrent syncope. EEG to evaluate for seizure. Level of alertness: Awake AEDs during EEG study: None Technical aspects: This EEG study was done with scalp electrodes positioned according to the 10-20 International system of electrode placement. Electrical activity was acquired at a sampling rate of 500Hz  and reviewed with a high frequency filter of 70Hz  and a low frequency filter of 1Hz . EEG data were recorded continuously and digitally stored. Description: The posterior dominant rhythm consists of 9 Hz activity of moderate voltage (25-35 uV) seen predominantly in posterior head regions, symmetric and reactive to eye opening and eye closing. Hyperventilation and photic stimulation were not performed.   IMPRESSION: This study is within normal limits. No seizures or epileptiform discharges were seen throughout the recording. Priyanka Annabelle Harman Yadav     Subjective: Eager to return to facility  Discharge Exam: Vitals:  10/21/19 0516 10/21/19 0926  BP: (!) 156/105 (!) 145/63  Pulse: 80 65  Resp: 18 18  Temp: 98.3 F (36.8 C) 98.7 F (37.1 C)  SpO2: 100% 100%   Vitals:   10/20/19 2100  10/20/19 2307 10/21/19 0516 10/21/19 0926  BP: (!) 146/68  (!) 156/105 (!) 145/63  Pulse: 90  80 65  Resp: 18  18 18   Temp: 98.2 F (36.8 C)  98.3 F (36.8 C) 98.7 F (37.1 C)  TempSrc: Oral   Oral  SpO2: 97%  100% 100%  Weight:  64.9 kg    Height:        General: Pt is alert, awake, not in acute distress Cardiovascular: RRR, S1/S2 +, no rubs, no gallops Respiratory: CTA bilaterally, no wheezing, no rhonchi Abdominal: Soft, NT, ND, bowel sounds + Extremities: no edema, no cyanosis   The results of significant diagnostics from this hospitalization (including imaging, microbiology, ancillary and laboratory) are listed below for reference.     Microbiology: Recent Results (from the past 240 hour(s))  SARS Coronavirus 2 by RT PCR (hospital order, performed in [facility] hospital lab) Nasopharyngeal Nasopharyngeal Swab     Status: None   Collection Time: 10/13/19  6:15 PM   Specimen: Nasopharyngeal Swab  Result Value Ref Range Status   SARS Coronavirus 2 NEGATIVE NEGATIVE Final    Comment: (NOTE) SARS-CoV-2 target nucleic acids are NOT DETECTED. The SARS-CoV-2 RNA is generally detectable in upper and lower respiratory specimens during the acute phase of infection. The lowest concentration of SARS-CoV-2 viral copies this assay can detect is 250 copies / mL. A negative result does not preclude SARS-CoV-2 infection and should not be used as the sole basis for treatment or other patient management decisions.  A negative result may occur with improper specimen collection / handling, submission of specimen other than nasopharyngeal swab, presence of viral mutation(s) within the areas targeted by this assay, and inadequate number of viral copies (<250 copies / mL). A negative result must be combined with clinical observations, patient history, and epidemiological information. Fact Sheet for Patients:   StrictlyIdeas.no Fact Sheet for Healthcare  Providers: BankingDealers.co.za This test is not yet approved or cleared  by the [place] FDA and has been authorized for detection and/or diagnosis of SARS-CoV-2 by FDA under an Emergency Use Authorization (EUA).  This EUA will remain in effect (meaning this test can be used) for the duration of the COVID-19 declaration under Section 564(b)(1) of the Act, 21 U.S.C. section 360bbb-3(b)(1), unless the authorization is terminated or revoked sooner. Performed at [facility], [place] [address], [place], [place] [address]   MRSA PCR Screening     Status: None   Collection Time: 10/14/19  5:30 AM   Specimen: Nasal Mucosa; Nasopharyngeal  Result Value Ref Range Status   MRSA by PCR NEGATIVE NEGATIVE Final    Comment:        The GeneXpert MRSA Assay (FDA approved for NASAL specimens only), is one component of a comprehensive MRSA colonization surveillance program. It is not intended to diagnose MRSA infection nor to guide or monitor treatment for MRSA infections. Performed at [facility], [place] [address] [place], [place] [address]   SARS Coronavirus 2 by RT PCR (hospital order, performed in [facility] hospital lab) Nasopharyngeal Nasopharyngeal Swab     Status: None   Collection Time: 10/16/19  2:23 PM   Specimen: Nasopharyngeal Swab  Result Value Ref Range Status   SARS Coronavirus 2  NEGATIVE NEGATIVE Final    Comment: (NOTE) SARS-CoV-2 target nucleic acids are NOT DETECTED.  The SARS-CoV-2 RNA is generally detectable in upper and lower respiratory specimens during the acute phase of infection. The lowest concentration of SARS-CoV-2 viral copies this assay can detect is 250 copies / mL. A negative result does not preclude SARS-CoV-2 infection and should not be used as the sole basis for treatment or other patient management decisions.  A negative result may occur with improper specimen collection / handling,  submission of specimen other than nasopharyngeal swab, presence of viral mutation(s) within the areas targeted by this assay, and inadequate number of viral copies (<250 copies / mL). A negative result must be combined with clinical observations, patient history, and epidemiological information.  Fact Sheet for Patients:   BoilerBrush.com.cy  Fact Sheet for Healthcare Providers: https://pope.com/  This test is not yet approved or  cleared by the [place] FDA and has been authorized for detection and/or diagnosis of SARS-CoV-2 by FDA under an Emergency Use Authorization (EUA).  This EUA will remain in effect (meaning this test can be used) for the duration of the COVID-19 declaration under Section 564(b)(1) of the Act, 21 U.S.C. section 360bbb-3(b)(1), unless the authorization is terminated or revoked sooner.  Performed at [facility], [address], [place], [place] [address]   SARS CORONAVIRUS 2 (TAT 6-24 HRS) Nasopharyngeal Nasopharyngeal Swab     Status: None   Collection Time: 10/20/19  6:32 PM   Specimen: Nasopharyngeal Swab  Result Value Ref Range Status   SARS Coronavirus 2 NEGATIVE NEGATIVE Final    Comment: (NOTE) SARS-CoV-2 target nucleic acids are NOT DETECTED.  The SARS-CoV-2 RNA is generally detectable in upper and lower respiratory specimens during the acute phase of infection. Negative results do not preclude SARS-CoV-2 infection, do not rule out co-infections with other pathogens, and should not be used as the sole basis for treatment or other patient management decisions. Negative results must be combined with clinical observations, patient history, and epidemiological information. The expected result is Negative.  Fact Sheet for Patients: HairSlick.no  Fact Sheet for Healthcare Providers: quierodirigir.com  This test is not yet approved or  cleared by the [place] FDA and  has been authorized for detection and/or diagnosis of SARS-CoV-2 by FDA under an Emergency Use Authorization (EUA). This EUA will remain  in effect (meaning this test can be used) for the duration of the COVID-19 declaration under Se ction 564(b)(1) of the Act, 21 U.S.C. section 360bbb-3(b)(1), unless the authorization is terminated or revoked sooner.  Performed at [facility], [address], [place], [place] [address]      Labs: BNP (last 3 results) No results for input(s): BNP in the last 8760 hours. Basic Metabolic Panel: Recent Labs  Lab 10/15/19 0516 10/16/19 1253 10/16/19 1255 10/17/19 0638  NA  --  139 140 139  K  --  4.5 4.3 4.2  CL  --  107 108 107  CO2  --  19*  --  21*  GLUCOSE  --  110* 104* 86  BUN  --  CREATININE  --  1.48* 1.40* 0.91  CALCIUM  --  8.7*  --  8.1*  MG  --  2.1  --   --   PHOS 2.0* 3.4  --   --    Liver Function Tests: Recent Labs  Lab 10/16/19 1253 10/17/19 0638  AST 29 26  ALT 18 20  ALKPHOS 75 68  BILITOT 1.0 0.9  PROT 6.4* 5.7*  ALBUMIN 2.7* 2.3*   No results for input(s): LIPASE, AMYLASE in the last 168 hours. No results for input(s): AMMONIA in the last 168 hours. CBC: Recent Labs  Lab 10/16/19 1253 10/16/19 1255 10/17/19 0638  WBC 8.7  --  7.8  NEUTROABS 6.5  --   --   HGB 12.7 13.3 11.5*  HCT 40.3 39.0 35.6*  MCV 94.4  --  93.9  PLT 228  --  312   Cardiac Enzymes: No results for input(s): CKTOTAL, CKMB, CKMBINDEX, TROPONINI in the last 168 hours. BNP: Invalid input(s): POCBNP CBG: Recent Labs  Lab 10/16/19 1113  GLUCAP 80   D-Dimer No results for input(s): DDIMER in the last 72 hours. Hgb A1c No results for input(s): HGBA1C in the last 72 hours. Lipid Profile No results for input(s): CHOL, HDL, LDLCALC, TRIG, CHOLHDL, LDLDIRECT in the last 72 hours. Thyroid function studies No results for input(s): TSH, T4TOTAL, T3FREE, THYROIDAB in the last 72  hours.  Invalid input(s): FREET3 Anemia work up No results for input(s): VITAMINB12, FOLATE, FERRITIN, TIBC, IRON, RETICCTPCT in the last 72 hours. Urinalysis    Component Value Date/Time   COLORURINE AMBER (A) 10/16/2019 1534   APPEARANCEUR CLOUDY (A) 10/16/2019 1534   LABSPEC 1.014 10/16/2019 1534   PHURINE 6.0 10/16/2019 1534   GLUCOSEU NEGATIVE 10/16/2019 1534   HGBUR SMALL (A) 10/16/2019 1534   BILIRUBINUR NEGATIVE 10/16/2019 1534   KETONESUR NEGATIVE 10/16/2019 1534   PROTEINUR 30 (A) 10/16/2019 1534   UROBILINOGEN 1.0 09/19/2010 1709   NITRITE NEGATIVE 10/16/2019 1534   LEUKOCYTESUR LARGE (A) 10/16/2019 1534   Sepsis Labs Invalid input(s): PROCALCITONIN,  WBC,  LACTICIDVEN Microbiology Recent Results (from the past 240 hour(s))  SARS Coronavirus 2 by RT PCR (hospital order, performed in [facility] hospital lab) Nasopharyngeal Nasopharyngeal Swab     Status: None   Collection Time: 10/13/19  6:15 PM   Specimen: Nasopharyngeal Swab  Result Value Ref Range Status   SARS Coronavirus 2 NEGATIVE NEGATIVE Final    Comment: (NOTE) SARS-CoV-2 target nucleic acids are NOT DETECTED. The SARS-CoV-2 RNA is generally detectable in upper and lower respiratory specimens during the acute phase of infection. The lowest concentration of SARS-CoV-2 viral copies this assay can detect is 250 copies / mL. A negative result does not preclude SARS-CoV-2 infection and should not be used as the sole basis for treatment or other patient management decisions.  A negative result may occur with improper specimen collection / handling, submission of specimen other than nasopharyngeal swab, presence of viral mutation(s) within the areas targeted by this assay, and inadequate number of viral copies (<250 copies / mL). A negative result must be combined with clinical observations, patient history, and epidemiological information. Fact Sheet for Patients:    BoilerBrush.com.cy Fact Sheet for Healthcare Providers: https://pope.com/ This test is not yet approved or cleared  by the [place] FDA and has been authorized for detection and/or diagnosis of SARS-CoV-2 by FDA under an Emergency Use Authorization (EUA).  This EUA will remain in effect (meaning this test can be used) for the duration of the COVID-19 declaration under Section 564(b)(1) of the Act, 21 U.S.C. section 360bbb-3(b)(1), unless the authorization is terminated or revoked sooner. Performed at [facility], [address], [place], [place] [address]   MRSA PCR Screening     Status: None   Collection Time: 10/14/19  5:30 AM   Specimen: Nasal Mucosa;  Nasopharyngeal  Result Value Ref Range Status   MRSA by PCR NEGATIVE NEGATIVE Final    Comment:        The GeneXpert MRSA Assay (FDA approved for NASAL specimens only), is one component of a comprehensive MRSA colonization surveillance program. It is not intended to diagnose MRSA infection nor to guide or monitor treatment for MRSA infections. Performed at [facility], [address] [place], [place] [address]   SARS Coronavirus 2 by RT PCR (hospital order, performed in [facility] hospital lab) Nasopharyngeal Nasopharyngeal Swab     Status: None   Collection Time: 10/16/19  2:23 PM   Specimen: Nasopharyngeal Swab  Result Value Ref Range Status   SARS Coronavirus 2 NEGATIVE NEGATIVE Final    Comment: (NOTE) SARS-CoV-2 target nucleic acids are NOT DETECTED.  The SARS-CoV-2 RNA is generally detectable in upper and lower respiratory specimens during the acute phase of infection. The lowest concentration of SARS-CoV-2 viral copies this assay can detect is 250 copies / mL. A negative result does not preclude SARS-CoV-2 infection and should not be used as the sole basis for treatment or other patient management decisions.  A negative  result may occur with improper specimen collection / handling, submission of specimen other than nasopharyngeal swab, presence of viral mutation(s) within the areas targeted by this assay, and inadequate number of viral copies (<250 copies / mL). A negative result must be combined with clinical observations, patient history, and epidemiological information.  Fact Sheet for Patients:   BoilerBrush.com.cy  Fact Sheet for Healthcare Providers: https://pope.com/  This test is not yet approved or  cleared by the [place] FDA and has been authorized for detection and/or diagnosis of SARS-CoV-2 by FDA under an Emergency Use Authorization (EUA).  This EUA will remain in effect (meaning this test can be used) for the duration of the COVID-19 declaration under Section 564(b)(1) of the Act, 21 U.S.C. section 360bbb-3(b)(1), unless the authorization is terminated or revoked sooner.  Performed at [facility], [address] [address] [place], [place] [address]   SARS CORONAVIRUS 2 (TAT 6-24 HRS) Nasopharyngeal Nasopharyngeal Swab     Status: None   Collection Time: 10/20/19  6:32 PM   Specimen: Nasopharyngeal Swab  Result Value Ref Range Status   SARS Coronavirus 2 NEGATIVE NEGATIVE Final    Comment: (NOTE) SARS-CoV-2 target nucleic acids are NOT DETECTED.  The SARS-CoV-2 RNA is generally detectable in upper and lower respiratory specimens during the acute phase of infection. Negative results do not preclude SARS-CoV-2 infection, do not rule out co-infections with other pathogens, and should not be used as the sole basis for treatment or other patient management decisions. Negative results must be combined with clinical observations, patient history, and epidemiological information. The expected result is Negative.  Fact Sheet for Patients: HairSlick.no  Fact Sheet for Healthcare  Providers: quierodirigir.com  This test is not yet approved or cleared by the [place] FDA and  has been authorized for detection and/or diagnosis of SARS-CoV-2 by FDA under an Emergency Use Authorization (EUA). This EUA will remain  in effect (meaning this test can be used) for the duration of the COVID-19 declaration under Se ction 564(b)(1) of the Act, 21 U.S.C. section 360bbb-3(b)(1), unless the authorization is terminated or revoked sooner.  Performed at [facility], [address], [place], [place] [address]    Time spent: 30 min  SIGNED:   Rickey Barbara, MD  [facility] 10/21/2019, 11:50 AM  If  7PM-7AM, please contact night-coverage

## 2019-10-21 NOTE — Progress Notes (Signed)
DISCHARGE NOTE [facility] to be discharged Skilled nursing facility per MD order. Patient verbalized understanding.  Skin clean, dry and intact without evidence of skin break down, no evidence of skin tears noted. IV catheter discontinued intact. Site without signs and symptoms of complications. Dressing and pressure applied. Pt denies pain at the site currently. No complaints noted.  Patient free of lines, drains, and wounds.   Discharge packet assembled. An After Visit Summary (AVS) was printed and given to the EMS personnel. Patient escorted via stretcher and discharged to [facility] via ambulance. Report called to accepting facility; all questions and concerns addressed.   I gave report to the receiving nurse at [facility] facility and patient left with [facility].  Pat Patrick, RN

## 2019-11-12 ENCOUNTER — Non-Acute Institutional Stay: Payer: Medicare Other | Admitting: Hospice

## 2019-11-12 ENCOUNTER — Other Ambulatory Visit: Payer: Self-pay

## 2019-11-12 DIAGNOSIS — Z515 Encounter for palliative care: Secondary | ICD-10-CM

## 2019-11-12 DIAGNOSIS — F0390 Unspecified dementia without behavioral disturbance: Secondary | ICD-10-CM

## 2019-11-12 NOTE — Progress Notes (Signed)
Therapist, nutritional Palliative Care Consult Note Telephone: [phone]  Fax: [phone]  PATIENT NAME: Amanda Owen DOB: [birth year] MRN: [id number]  PRIMARY CARE PROVIDER:   Charlesetta Shanks, MD  REFERRING PROVIDER: [place] Blattenberger NP RESPONSIBLE PARTY:  Kataleah Bejar- daughter [phone]    RECOMMENDATIONS/PLAN:   Advance Care Planning/Goals of Care: Visit at the request of   for palliative consult. Visit consisted of building trust and discussions on Palliative Medicine as specialized medical care for people living with serious illness, aimed at facilitating better quality of life through symptoms relief, assisting with advance care plan and establishing goals of care.  Patient is a DO NOT RESUSCITATE; goals of care include to maximize quality of life and symptom management.  Discussions with referring provider on patient's significant decline and possible eligibility for hospice service.  Also spoke with [name] on patient's significant decline.  She is on board for hospice service.  Referring provider - [place]- will consider putting in an order for hospice consult.  Palliative care will continue to support patient/family and the medical team. Symptom management: Chart review indicates patient was hospitalized 10/16/2019 to 6/17/2021Related to syncopal episode;Patient does have history of recurrent syncopal episodes.  Patient alert during visit, not able to engage in meaningful discussion.  Confusion/memory loss related to dementia fast 7B, FLACC 0.  Appetite is poor, patient no longer eating but taking only sips of Ensure and other fluids.  She is sleeping more and getting weaker by the day.  Patient is incontinent of bowel and bladder, bedbound.  Discussed findings with [place] and recommended referral for hospice consult. Follow up: Palliative care will continue to follow patient for goals of care clarification and symptom management. I spent 1 hour and 35  minutes providing this consultation; time iincludes time spent with patient/family, chart review, provider coordination,  and documentation. More than 50% of the time in this consultation was spent on coordinating communication  HISTORY OF PRESENT ILLNESS:  Amanda Owen is a 86 y.o. year old female with multiple medical problems including recurrent syncopal episodes, dementia, depression.  Palliative Care was asked to help address goals of care.   CODE STATUS: DNR  PPS: 20% HOSPICE ELIGIBILITY/DIAGNOSIS: Yes  PAST MEDICAL HISTORY:  Past Medical History:  Diagnosis Date   Chronic kidney failure    Depression    Hyperlipidemia    Hypertension    Memory deficit    Occlusion of carotid artery    Vitamin deficiency     SOCIAL HX:  Social History   Tobacco Use   Smoking status: Never Smoker   Smokeless tobacco: Never Used  Substance Use Topics   Alcohol use: No    ALLERGIES:  Allergies  Allergen Reactions   Ace Inhibitors Other (See Comments)    unknown     PERTINENT MEDICATIONS:  Outpatient Encounter Medications as of 11/12/2019  Medication Sig   amLODipine (NORVASC) 5 MG tablet Take 1 tablet (5 mg total) by mouth daily.   atorvastatin (LIPITOR) 40 MG tablet Take 40 mg by mouth daily.   citalopram (CELEXA) 20 MG tablet Take 20 mg by mouth daily.     dipyridamole-aspirin (AGGRENOX) 200-25 MG per 12 hr capsule Take 1 capsule by mouth 2 (two) times daily.   memantine (NAMENDA XR) 28 MG CP24 24 hr capsule Take 28 mg by mouth daily.   omeprazole (PRILOSEC) 20 MG capsule Take 20 mg by mouth daily.   traZODone (DESYREL) 50 MG tablet Take 50 mg by mouth at  bedtime.    No facility-administered encounter medications on file as of 11/12/2019.    PHYSICAL EXAM/ROS:  General: NAD Cardiovascular: regular rate and rhythm Pulmonary: clear ant fields Abdomen: soft, nontender, + bowel sounds GU: no suprapubic tenderness Extremities: no edema Skin: no rashes to exposed  skin Neurological: Weakness but otherwise nonfocal; confused  Rosaura Carpenter, NP

## 2021-03-09 IMAGING — DX DG CHEST 1V PORT
2 series · 2 of 2 positions shown · non-contrast
Comparison: Chest radiograph 10/09/2019.

CLINICAL DATA: Pneumonia.  Hypoxia.

EXAM:
PORTABLE CHEST 1 VIEW

[chest (1 of 2)]
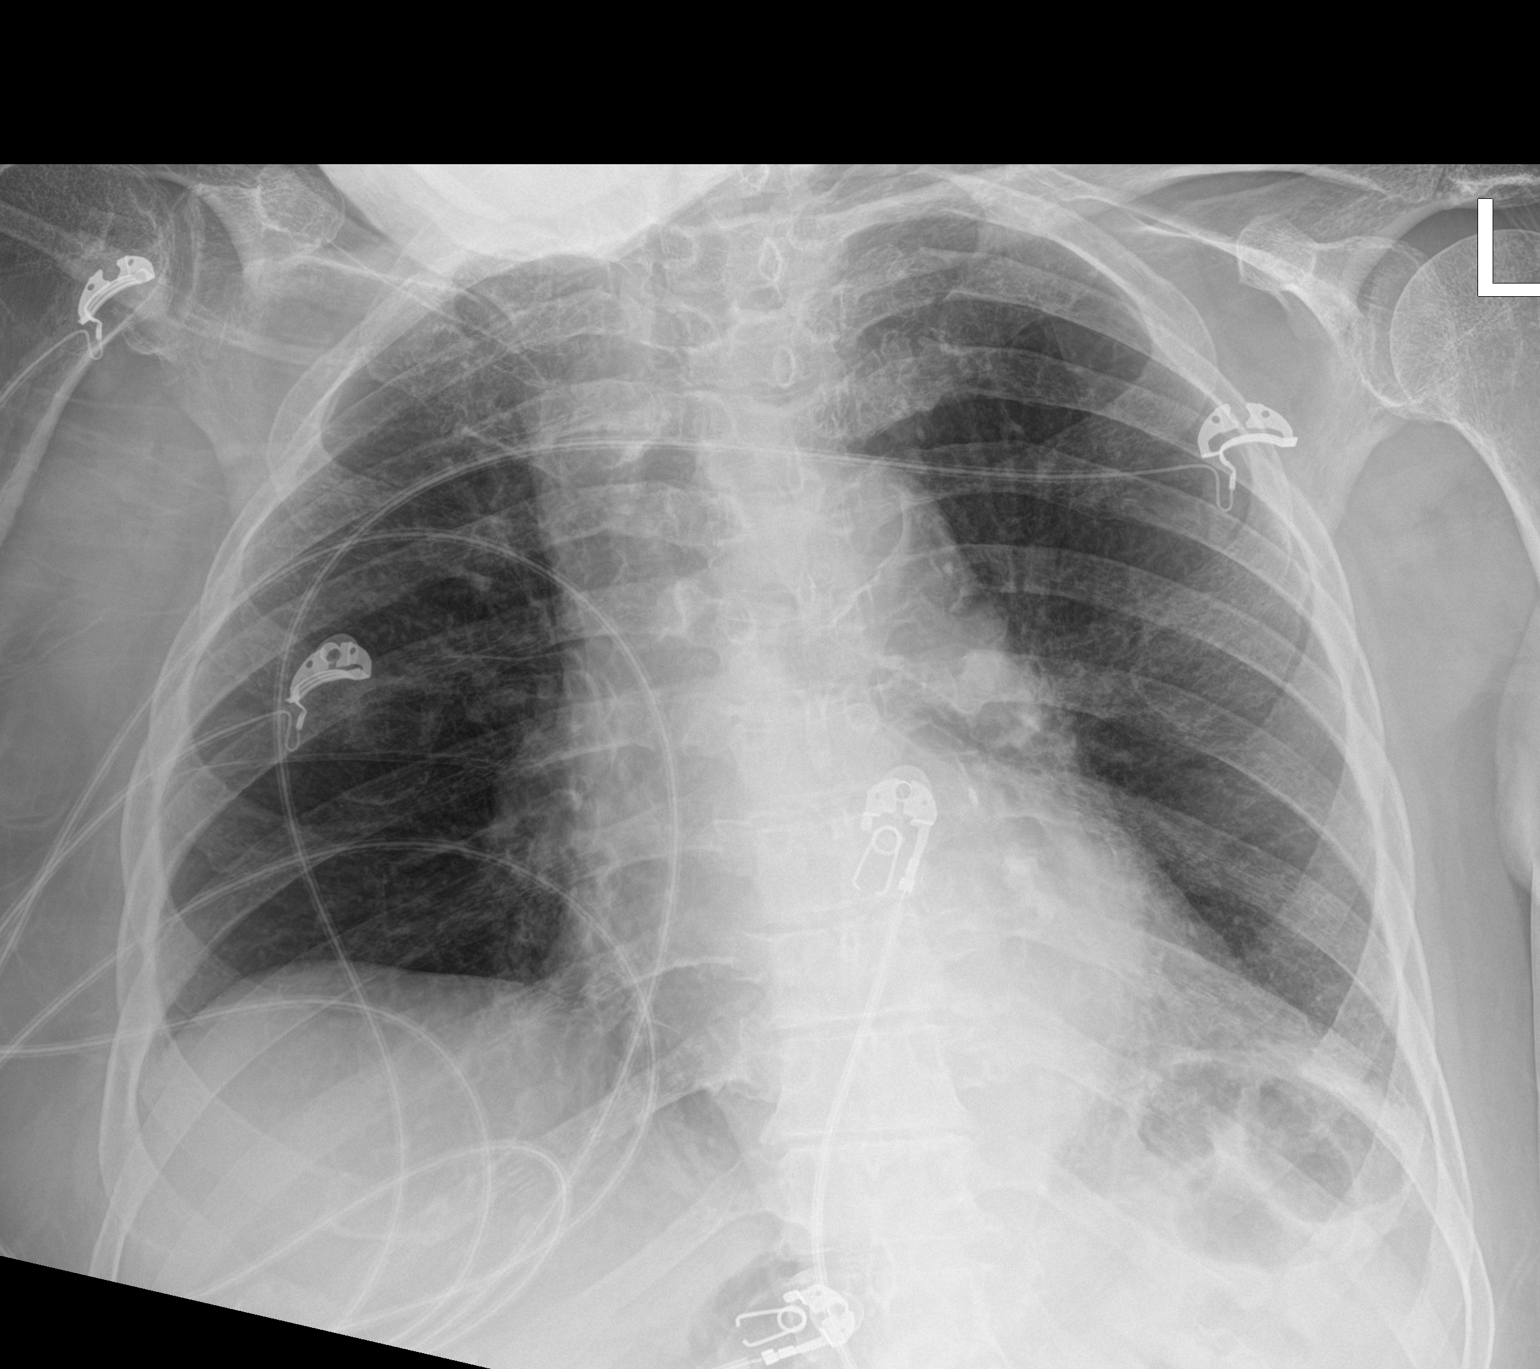

[chest (2 of 2)]
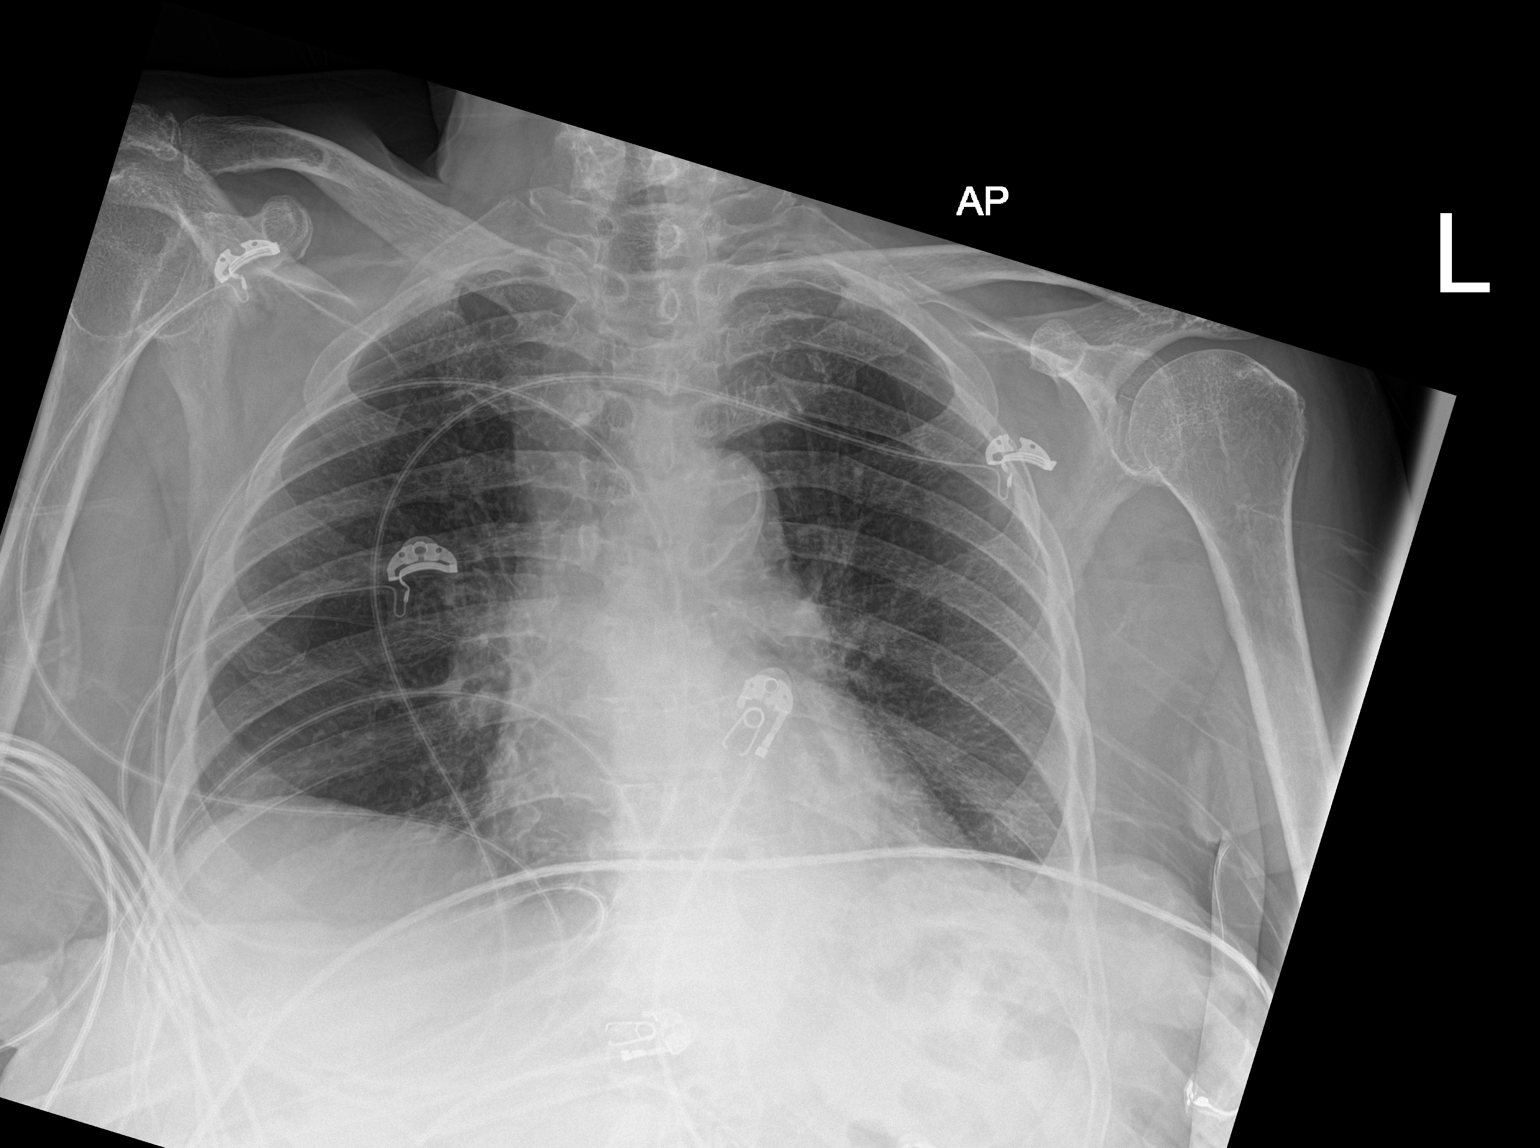

[2 of 2 positions shown; findings below may reference images not displayed]

FINDINGS: Low lung volumes. Patchy opacity at the left lung base. Unchanged
heart size and mediastinal contours. Aortic atherosclerosis. No
pulmonary edema, large pleural effusion or pneumothorax. Stable
osseous structures.
IMPRESSION: Patchy left lung base opacity, may represent atelectasis or
pneumonia.
